# Patient Record
Sex: Female | Born: 1935
Health system: Southern US, Community
[De-identification: ages and names within clinical notes are randomized; demographics above are authoritative.]

## PROBLEM LIST (undated history)

## (undated) DIAGNOSIS — F0390 Unspecified dementia without behavioral disturbance: Secondary | ICD-10-CM

## (undated) DIAGNOSIS — T8859XA Other complications of anesthesia, initial encounter: Secondary | ICD-10-CM

## (undated) DIAGNOSIS — J449 Chronic obstructive pulmonary disease, unspecified: Secondary | ICD-10-CM

## (undated) DIAGNOSIS — T4145XA Adverse effect of unspecified anesthetic, initial encounter: Secondary | ICD-10-CM

## (undated) HISTORY — PX: ANKLE SURGERY: SHX546

## (undated) HISTORY — PX: JOINT REPLACEMENT: SHX530

---

## 1998-08-18 ENCOUNTER — Other Ambulatory Visit: Admission: RE | Admit: 1998-08-18 | Discharge: 1998-08-18 | Payer: Self-pay | Admitting: Family Medicine

## 1998-10-31 ENCOUNTER — Encounter: Payer: Self-pay | Admitting: *Deleted

## 1998-10-31 ENCOUNTER — Ambulatory Visit (HOSPITAL_COMMUNITY): Admission: RE | Admit: 1998-10-31 | Discharge: 1998-10-31 | Payer: Self-pay | Admitting: *Deleted

## 1999-06-26 ENCOUNTER — Encounter: Payer: Self-pay | Admitting: Orthopedic Surgery

## 1999-07-03 ENCOUNTER — Inpatient Hospital Stay (HOSPITAL_COMMUNITY): Admission: RE | Admit: 1999-07-03 | Discharge: 1999-07-07 | Payer: Self-pay | Admitting: Orthopedic Surgery

## 1999-07-03 ENCOUNTER — Encounter: Payer: Self-pay | Admitting: Orthopedic Surgery

## 1999-07-07 ENCOUNTER — Inpatient Hospital Stay (HOSPITAL_COMMUNITY)
Admission: RE | Admit: 1999-07-07 | Discharge: 1999-07-13 | Payer: Self-pay | Admitting: Physical Medicine & Rehabilitation

## 2000-05-15 ENCOUNTER — Other Ambulatory Visit: Admission: RE | Admit: 2000-05-15 | Discharge: 2000-05-15 | Payer: Self-pay | Admitting: Family Medicine

## 2000-05-17 ENCOUNTER — Encounter: Payer: Self-pay | Admitting: Family Medicine

## 2000-05-17 ENCOUNTER — Encounter: Admission: RE | Admit: 2000-05-17 | Discharge: 2000-05-17 | Payer: Self-pay | Admitting: Family Medicine

## 2000-11-01 ENCOUNTER — Inpatient Hospital Stay (HOSPITAL_COMMUNITY): Admission: EM | Admit: 2000-11-01 | Discharge: 2000-11-03 | Payer: Self-pay

## 2000-12-19 ENCOUNTER — Ambulatory Visit: Admission: RE | Admit: 2000-12-19 | Discharge: 2000-12-19 | Payer: Self-pay | Admitting: Pulmonary Disease

## 2001-05-23 ENCOUNTER — Encounter: Payer: Self-pay | Admitting: Family Medicine

## 2001-05-23 ENCOUNTER — Encounter: Admission: RE | Admit: 2001-05-23 | Discharge: 2001-05-23 | Payer: Self-pay | Admitting: Family Medicine

## 2002-01-21 ENCOUNTER — Encounter: Payer: Self-pay | Admitting: Family Medicine

## 2002-01-21 ENCOUNTER — Encounter: Admission: RE | Admit: 2002-01-21 | Discharge: 2002-01-21 | Payer: Self-pay | Admitting: Family Medicine

## 2002-09-09 ENCOUNTER — Encounter: Admission: RE | Admit: 2002-09-09 | Discharge: 2002-09-09 | Payer: Self-pay | Admitting: Family Medicine

## 2002-09-09 ENCOUNTER — Encounter: Payer: Self-pay | Admitting: Family Medicine

## 2002-10-06 ENCOUNTER — Emergency Department (HOSPITAL_COMMUNITY): Admission: EM | Admit: 2002-10-06 | Discharge: 2002-10-06 | Payer: Self-pay | Admitting: Emergency Medicine

## 2002-10-06 ENCOUNTER — Encounter: Payer: Self-pay | Admitting: Emergency Medicine

## 2003-06-09 ENCOUNTER — Encounter: Admission: RE | Admit: 2003-06-09 | Discharge: 2003-06-09 | Payer: Self-pay | Admitting: Family Medicine

## 2003-09-17 ENCOUNTER — Encounter: Admission: RE | Admit: 2003-09-17 | Discharge: 2003-09-17 | Payer: Self-pay | Admitting: Family Medicine

## 2004-04-24 ENCOUNTER — Encounter: Admission: RE | Admit: 2004-04-24 | Discharge: 2004-04-24 | Payer: Self-pay | Admitting: Family Medicine

## 2004-11-30 ENCOUNTER — Encounter: Admission: RE | Admit: 2004-11-30 | Discharge: 2004-11-30 | Payer: Self-pay | Admitting: Family Medicine

## 2007-03-27 ENCOUNTER — Encounter (INDEPENDENT_AMBULATORY_CARE_PROVIDER_SITE_OTHER): Payer: Self-pay | Admitting: Family Medicine

## 2007-03-28 ENCOUNTER — Ambulatory Visit: Payer: Self-pay | Admitting: Family Medicine

## 2007-03-28 DIAGNOSIS — M1990 Unspecified osteoarthritis, unspecified site: Secondary | ICD-10-CM | POA: Insufficient documentation

## 2007-03-28 DIAGNOSIS — M810 Age-related osteoporosis without current pathological fracture: Secondary | ICD-10-CM | POA: Insufficient documentation

## 2007-03-28 DIAGNOSIS — J449 Chronic obstructive pulmonary disease, unspecified: Secondary | ICD-10-CM | POA: Insufficient documentation

## 2007-03-28 DIAGNOSIS — R002 Palpitations: Secondary | ICD-10-CM

## 2007-03-28 DIAGNOSIS — R32 Unspecified urinary incontinence: Secondary | ICD-10-CM | POA: Insufficient documentation

## 2007-03-31 ENCOUNTER — Telehealth (INDEPENDENT_AMBULATORY_CARE_PROVIDER_SITE_OTHER): Payer: Self-pay | Admitting: *Deleted

## 2007-04-01 ENCOUNTER — Encounter (INDEPENDENT_AMBULATORY_CARE_PROVIDER_SITE_OTHER): Payer: Self-pay | Admitting: Family Medicine

## 2007-06-09 ENCOUNTER — Emergency Department (HOSPITAL_COMMUNITY): Admission: EM | Admit: 2007-06-09 | Discharge: 2007-06-10 | Payer: Self-pay | Admitting: Emergency Medicine

## 2007-06-16 ENCOUNTER — Ambulatory Visit: Payer: Self-pay | Admitting: Gastroenterology

## 2007-06-24 ENCOUNTER — Emergency Department (HOSPITAL_COMMUNITY): Admission: EM | Admit: 2007-06-24 | Discharge: 2007-06-24 | Payer: Self-pay | Admitting: Emergency Medicine

## 2007-06-25 ENCOUNTER — Telehealth (INDEPENDENT_AMBULATORY_CARE_PROVIDER_SITE_OTHER): Payer: Self-pay | Admitting: Family Medicine

## 2007-06-25 ENCOUNTER — Emergency Department (HOSPITAL_COMMUNITY): Admission: EM | Admit: 2007-06-25 | Discharge: 2007-06-25 | Payer: Self-pay | Admitting: *Deleted

## 2007-07-04 ENCOUNTER — Ambulatory Visit: Payer: Self-pay | Admitting: Gastroenterology

## 2007-07-04 ENCOUNTER — Encounter (INDEPENDENT_AMBULATORY_CARE_PROVIDER_SITE_OTHER): Payer: Self-pay | Admitting: Family Medicine

## 2007-07-04 ENCOUNTER — Encounter: Payer: Self-pay | Admitting: Gastroenterology

## 2007-07-21 ENCOUNTER — Ambulatory Visit: Payer: Self-pay | Admitting: Internal Medicine

## 2007-07-21 DIAGNOSIS — L0390 Cellulitis, unspecified: Secondary | ICD-10-CM

## 2007-07-21 DIAGNOSIS — L0291 Cutaneous abscess, unspecified: Secondary | ICD-10-CM | POA: Insufficient documentation

## 2007-07-23 ENCOUNTER — Telehealth (INDEPENDENT_AMBULATORY_CARE_PROVIDER_SITE_OTHER): Payer: Self-pay | Admitting: *Deleted

## 2007-07-28 ENCOUNTER — Encounter (HOSPITAL_BASED_OUTPATIENT_CLINIC_OR_DEPARTMENT_OTHER): Admission: RE | Admit: 2007-07-28 | Discharge: 2007-10-13 | Payer: Self-pay | Admitting: Surgery

## 2007-08-28 ENCOUNTER — Encounter: Payer: Self-pay | Admitting: Internal Medicine

## 2007-10-31 ENCOUNTER — Encounter (INDEPENDENT_AMBULATORY_CARE_PROVIDER_SITE_OTHER): Payer: Self-pay | Admitting: *Deleted

## 2009-06-20 ENCOUNTER — Inpatient Hospital Stay (HOSPITAL_COMMUNITY): Admission: RE | Admit: 2009-06-20 | Discharge: 2009-06-24 | Payer: Self-pay | Admitting: Orthopedic Surgery

## 2009-07-18 ENCOUNTER — Encounter: Admission: RE | Admit: 2009-07-18 | Discharge: 2009-10-04 | Payer: Self-pay | Admitting: Orthopedic Surgery

## 2010-02-01 ENCOUNTER — Inpatient Hospital Stay (HOSPITAL_COMMUNITY): Admission: EM | Admit: 2010-02-01 | Discharge: 2010-02-06 | Payer: Self-pay | Admitting: Emergency Medicine

## 2010-05-16 ENCOUNTER — Encounter
Admission: RE | Admit: 2010-05-16 | Discharge: 2010-06-07 | Payer: Self-pay | Source: Home / Self Care | Attending: Specialist | Admitting: Specialist

## 2010-06-07 ENCOUNTER — Encounter
Admission: RE | Admit: 2010-06-07 | Discharge: 2010-07-11 | Payer: Self-pay | Source: Home / Self Care | Attending: Specialist | Admitting: Specialist

## 2010-07-09 LAB — CONVERTED CEMR LAB
Basophils Absolute: 0 10*3/uL (ref 0.0–0.1)
Basophils Relative: 0 % (ref 0.0–1.0)
Eosinophils Absolute: 0.1 10*3/uL (ref 0.0–0.6)
Eosinophils Relative: 0.7 % (ref 0.0–5.0)
HCT: 41.5 % (ref 36.0–46.0)
Hemoglobin: 13.9 g/dL (ref 12.0–15.0)
Lymphocytes Relative: 11.2 % — ABNORMAL LOW (ref 12.0–46.0)
MCHC: 33.5 g/dL (ref 30.0–36.0)
MCV: 90.3 fL (ref 78.0–100.0)
Monocytes Absolute: 0.7 10*3/uL (ref 0.2–0.7)
Monocytes Relative: 8.2 % (ref 3.0–11.0)
Neutro Abs: 6.6 10*3/uL (ref 1.4–7.7)
Neutrophils Relative %: 79.9 % — ABNORMAL HIGH (ref 43.0–77.0)
Platelets: 223 10*3/uL (ref 150–400)
RBC: 4.59 M/uL (ref 3.87–5.11)
RDW: 13.4 % (ref 11.5–14.6)
TSH: 0.78 microintl units/mL (ref 0.35–5.50)
WBC: 8.3 10*3/uL (ref 4.5–10.5)

## 2010-07-13 ENCOUNTER — Ambulatory Visit: Payer: Medicare Other | Attending: Specialist | Admitting: Physical Therapy

## 2010-07-13 DIAGNOSIS — Z96649 Presence of unspecified artificial hip joint: Secondary | ICD-10-CM | POA: Insufficient documentation

## 2010-07-13 DIAGNOSIS — M25559 Pain in unspecified hip: Secondary | ICD-10-CM | POA: Insufficient documentation

## 2010-07-13 DIAGNOSIS — IMO0001 Reserved for inherently not codable concepts without codable children: Secondary | ICD-10-CM | POA: Insufficient documentation

## 2010-07-13 DIAGNOSIS — R262 Difficulty in walking, not elsewhere classified: Secondary | ICD-10-CM | POA: Insufficient documentation

## 2010-07-13 DIAGNOSIS — M25569 Pain in unspecified knee: Secondary | ICD-10-CM | POA: Insufficient documentation

## 2010-07-13 DIAGNOSIS — M25669 Stiffness of unspecified knee, not elsewhere classified: Secondary | ICD-10-CM | POA: Insufficient documentation

## 2010-07-13 DIAGNOSIS — Z96659 Presence of unspecified artificial knee joint: Secondary | ICD-10-CM | POA: Insufficient documentation

## 2010-07-18 ENCOUNTER — Encounter: Payer: Medicare Other | Admitting: Physical Therapy

## 2010-07-18 ENCOUNTER — Ambulatory Visit: Payer: Medicare Other | Admitting: Physical Therapy

## 2010-07-20 ENCOUNTER — Encounter: Payer: Medicare Other | Admitting: Physical Therapy

## 2010-07-21 ENCOUNTER — Ambulatory Visit: Payer: Medicare Other | Admitting: Physical Therapy

## 2010-07-25 ENCOUNTER — Ambulatory Visit: Payer: Medicare Other | Admitting: Physical Therapy

## 2010-07-27 ENCOUNTER — Ambulatory Visit: Payer: Medicare Other | Admitting: Physical Therapy

## 2010-08-25 LAB — CBC
HCT: 36.2 % (ref 36.0–46.0)
HCT: 40.5 % (ref 36.0–46.0)
HCT: 42.4 % (ref 36.0–46.0)
Hemoglobin: 12.2 g/dL (ref 12.0–15.0)
Hemoglobin: 13.7 g/dL (ref 12.0–15.0)
Hemoglobin: 14.4 g/dL (ref 12.0–15.0)
MCH: 29.8 pg (ref 26.0–34.0)
MCH: 29.9 pg (ref 26.0–34.0)
MCH: 30.3 pg (ref 26.0–34.0)
MCHC: 33.7 g/dL (ref 30.0–36.0)
MCHC: 33.7 g/dL (ref 30.0–36.0)
MCHC: 34.1 g/dL (ref 30.0–36.0)
MCV: 88.3 fL (ref 78.0–100.0)
MCV: 88.7 fL (ref 78.0–100.0)
MCV: 89 fL (ref 78.0–100.0)
Platelets: 148 10*3/uL — ABNORMAL LOW (ref 150–400)
Platelets: 153 10*3/uL (ref 150–400)
Platelets: 167 10*3/uL (ref 150–400)
RBC: 4.08 MIL/uL (ref 3.87–5.11)
RBC: 4.59 MIL/uL (ref 3.87–5.11)
RBC: 4.77 MIL/uL (ref 3.87–5.11)
RDW: 15.1 % (ref 11.5–15.5)
RDW: 15.2 % (ref 11.5–15.5)
RDW: 15.2 % (ref 11.5–15.5)
WBC: 5.4 10*3/uL (ref 4.0–10.5)
WBC: 7.6 10*3/uL (ref 4.0–10.5)
WBC: 9 10*3/uL (ref 4.0–10.5)

## 2010-08-25 LAB — BASIC METABOLIC PANEL
BUN: 5 mg/dL — ABNORMAL LOW (ref 6–23)
BUN: 7 mg/dL (ref 6–23)
BUN: 8 mg/dL (ref 6–23)
CO2: 27 mEq/L (ref 19–32)
CO2: 28 mEq/L (ref 19–32)
CO2: 28 mEq/L (ref 19–32)
Calcium: 8.7 mg/dL (ref 8.4–10.5)
Calcium: 8.8 mg/dL (ref 8.4–10.5)
Calcium: 9.2 mg/dL (ref 8.4–10.5)
Chloride: 106 mEq/L (ref 96–112)
Chloride: 108 mEq/L (ref 96–112)
Chloride: 109 mEq/L (ref 96–112)
Creatinine, Ser: 0.67 mg/dL (ref 0.4–1.2)
Creatinine, Ser: 0.71 mg/dL (ref 0.4–1.2)
Creatinine, Ser: 0.9 mg/dL (ref 0.4–1.2)
GFR calc Af Amer: >60 mL/min (ref >60)
GFR calc Af Amer: >60 mL/min (ref >60)
GFR calc Af Amer: >60 mL/min (ref >60)
GFR calc non Af Amer: >60 mL/min (ref >60)
GFR calc non Af Amer: >60 mL/min (ref >60)
GFR calc non Af Amer: >60 mL/min (ref >60)
Glucose, Bld: 120 mg/dL — ABNORMAL HIGH (ref 70–99)
Glucose, Bld: 86 mg/dL (ref 70–99)
Glucose, Bld: 91 mg/dL (ref 70–99)
Potassium: 3.4 mEq/L — ABNORMAL LOW (ref 3.5–5.1)
Potassium: 3.8 mEq/L (ref 3.5–5.1)
Potassium: 3.9 mEq/L (ref 3.5–5.1)
Sodium: 139 mEq/L (ref 135–145)
Sodium: 140 mEq/L (ref 135–145)
Sodium: 142 mEq/L (ref 135–145)

## 2010-08-25 LAB — URINALYSIS, MICROSCOPIC ONLY
Bilirubin Urine: NEGATIVE
Glucose, UA: NEGATIVE mg/dL
Ketones, ur: NEGATIVE mg/dL
Nitrite: NEGATIVE
Protein, ur: 30 mg/dL — AB
Specific Gravity, Urine: 1.012 (ref 1.005–1.030)
Urobilinogen, UA: 0.2 mg/dL (ref 0.0–1.0)
pH: 7.5 (ref 5.0–8.0)

## 2010-08-25 LAB — DIFFERENTIAL
Basophils Absolute: 0 10*3/uL (ref 0.0–0.1)
Basophils Relative: 0 % (ref 0–1)
Eosinophils Absolute: 0 10*3/uL (ref 0.0–0.7)
Eosinophils Relative: 0 % (ref 0–5)
Lymphocytes Relative: 12 % (ref 12–46)
Lymphs Abs: 0.9 10*3/uL (ref 0.7–4.0)
Monocytes Absolute: 0.6 10*3/uL (ref 0.1–1.0)
Monocytes Relative: 7 % (ref 3–12)
Neutro Abs: 6 10*3/uL (ref 1.7–7.7)
Neutrophils Relative %: 80 % — ABNORMAL HIGH (ref 43–77)

## 2010-08-25 LAB — URINE CULTURE
Colony Count: >=100000
Culture  Setup Time: 201108300115

## 2010-08-27 LAB — BASIC METABOLIC PANEL
BUN: 3 mg/dL — ABNORMAL LOW (ref 6–23)
BUN: 5 mg/dL — ABNORMAL LOW (ref 6–23)
CO2: 26 mEq/L (ref 19–32)
CO2: 28 mEq/L (ref 19–32)
Calcium: 8 mg/dL — ABNORMAL LOW (ref 8.4–10.5)
Chloride: 100 mEq/L (ref 96–112)
Chloride: 103 mEq/L (ref 96–112)
Chloride: 107 mEq/L (ref 96–112)
Creatinine, Ser: 0.6 mg/dL (ref 0.4–1.2)
Creatinine, Ser: 0.64 mg/dL (ref 0.4–1.2)
GFR calc Af Amer: >60 mL/min (ref >60)
Glucose, Bld: 155 mg/dL — ABNORMAL HIGH (ref 70–99)
Glucose, Bld: 94 mg/dL (ref 70–99)
Potassium: 3.2 mEq/L — ABNORMAL LOW (ref 3.5–5.1)
Potassium: 3.8 mEq/L (ref 3.5–5.1)
Potassium: 4.3 mEq/L (ref 3.5–5.1)
Sodium: 133 mEq/L — ABNORMAL LOW (ref 135–145)

## 2010-08-27 LAB — CBC
HCT: 28.6 % — ABNORMAL LOW (ref 36.0–46.0)
HCT: 31.3 % — ABNORMAL LOW (ref 36.0–46.0)
HCT: 33.1 % — ABNORMAL LOW (ref 36.0–46.0)
HCT: 41.4 % (ref 36.0–46.0)
Hemoglobin: 10.5 g/dL — ABNORMAL LOW (ref 12.0–15.0)
Hemoglobin: 10.6 g/dL — ABNORMAL LOW (ref 12.0–15.0)
Hemoglobin: 13.7 g/dL (ref 12.0–15.0)
MCHC: 33.2 g/dL (ref 30.0–36.0)
MCHC: 33.5 g/dL (ref 30.0–36.0)
MCHC: 33.6 g/dL (ref 30.0–36.0)
MCHC: 33.9 g/dL (ref 30.0–36.0)
MCHC: 33.9 g/dL (ref 30.0–36.0)
MCV: 90.1 fL (ref 78.0–100.0)
MCV: 90.1 fL (ref 78.0–100.0)
MCV: 90.5 fL (ref 78.0–100.0)
MCV: 90.7 fL (ref 78.0–100.0)
MCV: 91.2 fL (ref 78.0–100.0)
Platelets: 121 10*3/uL — ABNORMAL LOW (ref 150–400)
Platelets: 130 10*3/uL — ABNORMAL LOW (ref 150–400)
Platelets: 187 10*3/uL (ref 150–400)
RBC: 3.48 MIL/uL — ABNORMAL LOW (ref 3.87–5.11)
RBC: 3.51 MIL/uL — ABNORMAL LOW (ref 3.87–5.11)
RBC: 4.57 MIL/uL (ref 3.87–5.11)
RDW: 13.8 % (ref 11.5–15.5)
RDW: 13.8 % (ref 11.5–15.5)
RDW: 14.1 % (ref 11.5–15.5)
RDW: 14.6 % (ref 11.5–15.5)
WBC: 6.7 10*3/uL (ref 4.0–10.5)
WBC: 7.3 10*3/uL (ref 4.0–10.5)

## 2010-08-27 LAB — URINALYSIS, ROUTINE W REFLEX MICROSCOPIC
Bilirubin Urine: NEGATIVE
Glucose, UA: NEGATIVE mg/dL
Hgb urine dipstick: NEGATIVE
Ketones, ur: NEGATIVE mg/dL
Nitrite: NEGATIVE
Protein, ur: NEGATIVE mg/dL
Specific Gravity, Urine: 1.013 (ref 1.005–1.030)
Urobilinogen, UA: 0.2 mg/dL (ref 0.0–1.0)
pH: 7.5 (ref 5.0–8.0)

## 2010-08-27 LAB — PROTIME-INR
INR: 1.01 (ref 0.00–1.49)
INR: 1.13 (ref 0.00–1.49)
Prothrombin Time: 13.2 seconds (ref 11.6–15.2)
Prothrombin Time: 20.5 seconds — ABNORMAL HIGH (ref 11.6–15.2)

## 2010-08-27 LAB — COMPREHENSIVE METABOLIC PANEL
ALT: 16 U/L (ref 0–35)
AST: 26 U/L (ref 0–37)
Albumin: 3.8 g/dL (ref 3.5–5.2)
Alkaline Phosphatase: 77 U/L (ref 39–117)
BUN: 10 mg/dL (ref 6–23)
CO2: 30 mEq/L (ref 19–32)
Calcium: 9.2 mg/dL (ref 8.4–10.5)
Chloride: 105 mEq/L (ref 96–112)
Creatinine, Ser: 0.77 mg/dL (ref 0.4–1.2)
GFR calc Af Amer: >60 mL/min (ref >60)
GFR calc non Af Amer: >60 mL/min (ref >60)
Glucose, Bld: 92 mg/dL (ref 70–99)
Potassium: 3.8 mEq/L (ref 3.5–5.1)
Sodium: 142 mEq/L (ref 135–145)
Total Bilirubin: 0.6 mg/dL (ref 0.3–1.2)
Total Protein: 6.6 g/dL (ref 6.0–8.3)

## 2010-08-27 LAB — TYPE AND SCREEN: ABO/RH(D): O POS

## 2010-08-27 LAB — ABO/RH: ABO/RH(D): O POS

## 2010-08-27 LAB — APTT: aPTT: 30 seconds (ref 24–37)

## 2010-10-24 NOTE — Assessment & Plan Note (Signed)
[facility]   NAME:  Donna Flowers, Donna Flowers               ACCOUNT NO.:  [id number]   MEDICAL RECORD NO.:  [id number]      DATE OF BIRTH:  10/06/1927   PHYSICIAN:  Theresia Majors. Tanda Rockers, M.D. VISIT DATE:  09/04/2007                                   OFFICE VISIT   SUBJECTIVE:  The patient is a 77 year old lady who we have treated for  stasis ulcer involving her right lower extremity.  In the interim she  has worn compressive wraps.  She returns for follow-up.  There has been  no excessive drainage, malodor, pain, or fever.   OBJECTIVE:  VITAL SIGNS:  Blood pressure 141/73, respirations 16, pulse  rate 76, and temperature 98.  EXTREMITIES:  Inspection of her right lower extremity shows that the  ulcer is completely resolved.  The edema has been reasonably controlled  with the compression.   PLAN:  We are discharging the patient with a prescription for bilateral  open toe 30-40 mm compression hose.  We instructed her in the use of  compression garments.  We have instructed her to keep her routine  medical appointments with her primary care physician.  We have expressed  a willingness to reevaluate her on a p.r.n. basis.      Harold A. Tanda Rockers, M.D.  Electronically Signed     HAN/MEDQ  D:  09/04/2007  T:  09/04/2007  Job:  [id number]   cc:   Willow Ora, MD

## 2010-10-24 NOTE — Assessment & Plan Note (Signed)
[facility]   NAME:  Donna Flowers, Donna Flowers               ACCOUNT NO.:  [id number]   MEDICAL RECORD NO.:  [id number]      DATE OF BIRTH:  January 21, 1928   PHYSICIAN:  Theresia Majors. Tanda Rockers, M.D. VISIT DATE:  08/28/2007                                   OFFICE VISIT   SUBJECTIVE:  The patient is a 77 year old lady we are following for a  stasis ulcer involving the right lower extremity.  In the interim she  has worn an Print production planner.  There has been no excessive drainage, malodor,  pain, or fever.  She returns for follow-up.   OBJECTIVE:  VITAL SIGNS:  Blood pressure 139/76, respirations 16, pulse  rate 73, temperature 97.6.  EXTREMITIES:  Right lower extremity shows that the ulcer has  significantly decreased.  There is a miniscule area approximately 3 mm  in diameter with minimum inflammatory reaction and no evidence of  ascending infection.  The foot is warm, but it is not feverish.  The  capillary refill is brisk.  There is no associated ischemia.  The edema  is moderately well controlled as manifested by linear wrinkles.  There  is no evidence of wrap injury.   ASSESSMENT:  Satisfactory response to compression of the stasis ulcer.   PLAN:  We will return the patient to an [place] boot.  She has been given a  prescription for bilateral below the knee 20-30 mm external support  hose.  The nurse will reevaluate her weekly.  She will be reevaluated by  the physician in 2 weeks.      Harold A. Tanda Rockers, M.D.  Electronically Signed     HAN/MEDQ  D:  08/28/2007  T:  08/28/2007  Job:  [id number]

## 2010-10-24 NOTE — Consult Note (Signed)
Donna Flowers, Donna Flowers               ACCOUNT NO.:  [id number]   MEDICAL RECORD NO.:  [id number]          PATIENT TYPE:  REC   LOCATION:  FOOT                         FACILITY:  [facility]   PHYSICIAN:  Harold A. Tanda Rockers, M.D.DATE OF BIRTH:  1927/09/26   DATE OF CONSULTATION:  07/31/2007  DATE OF DISCHARGE:                                 CONSULTATION   SUBJECTIVE:  Donna Flowers is a 77 year old lady referred by Dr. Willow Ora  for evaluation of an ulceration on the lateral aspect of the right lower  extremity.   IMPRESSION:  Post-traumatic stasis ulcer.   RECOMMENDATIONS:  The wound was thoroughly debrided under an anesthetic  mixture block followed by the placement of a compressive wrap.  We will  recommend we proceed with an Unna boot protocol, anticipating complete  resolution.   SUBJECTIVE:  Donna Flowers is a 77 year old lady who injured her right  lower extremity 10 weeks ago.  She has treated this wound intermittently  with gauze dressings and over-the-counter antibiotics.  (Topical).  She  has also had 1 course of p.o. Keflex.  The wound has shown some minimal  improvement but is persistent, is associated with swelling, drainage and  redness.  She has moderate pain that does not prohibit her from  sleeping.  She continues to be ambulatory.   PAST MEDICAL HISTORY:  Remarkable for arthritis and COPD.   CURRENT MEDICATIONS:  1. Os-Cal, vitamin C, Caltrate and an aspirin.  In addition, she takes      Spiriva inhaler.  2. Detrol 2 mg b.i.d.  3. Keflex 500 mg q.6 h.  She has had a 10-day course and Forteo 750      mcg injection daily subcu.   She denies allergies.   PREVIOUS SURGERIES:  1. Right hip replacement x2.  2. A right ankle open reduction and internal fixation.  3. Closed reduction of the right wrist.   FAMILY HISTORY:  Positive for vascular disease, diabetes and cancer.   SOCIALLY:  She is married; she is still employed.  She has adult  children who live in the local  area.   REVIEW OF SYSTEMS:  She has exercise tolerance that is compromised due  to her arthritis, primarily in her knees.  She is able to walk  independently.  She specifically denies angina pectoris.  She denies  transient visual losses or paralysis or any other stigmata of TIAs.  She  has some urinary stress incontinence.  There are no GI complaints.  Her  appetite is good.  Her weight is stable.   PHYSICAL EXAM:  She is an alert, oriented female in no acute distress.  She is in good contact with reality and is able to give her history in  detail.  Her blood pressure is 160/73, respirations 16, pulse rate 77,  temperature 97.6.  HEENT:  Clear.  NECK:  Supple.  Trachea is midline.  Thyroid is nonpalpable.  LUNGS:  Clear.  The heart sounds were normal.  ABDOMEN:  Soft.  EXTREMITIES:  Warm with bilateral +2 edema.  There is hyperemia in the  right lower  extremity associated with a full-thickness ulceration with  varying amounts of subcutaneous necrosis and soft as well as mature  eschar.  There is a halo of erythema.  There is no malodor.  The wound  measures 4 cm in its largest diameter.  The wound was measured and  photographed; please refer to the data entries.  The pedal pulses are  faintly palpable.  A pencil Doppler shows a normal ABI bilaterally with  attenuated Doppler wave signals.  The patient retains protective  sensation to the Semmes-Weinstein filament.  There are no dominant skin  lesions.  There is no regional adenopathy.   DISCUSSION:  Donna Flowers has a traumatic wound which is now converted to  stasis physiology with significant fluid retention.  We have recommended  that we proceed with an Unna boot protocol following adequate  debridement.  There is no active infection.  There is no critical flow  reduction.  We anticipate that this wound should respond to compression  therapy.  We will reevaluate her weekly, and we will perform  debridements as indicated.  We  have given the patient an opportunity to  ask questions.  She seems to understand the clinical impression and the  treatment plan.  She expresses gratitude for having been seen in the  clinic and indicates that she will be compliant.      Harold A. Tanda Rockers, M.D.  Electronically Signed     HAN/MEDQ  D:  08/01/2007  T:  08/02/2007  Job:  [id number]   cc:   Willow Ora, MD

## 2010-10-24 NOTE — Assessment & Plan Note (Signed)
[facility]   NAME:  Donna Flowers, Donna Flowers               ACCOUNT NO.:  [id number]   MEDICAL RECORD NO.:  [id number]      DATE OF BIRTH:  [birth year]   PHYSICIAN:  Theresia Majors. Tanda Rockers, M.D. VISIT DATE:  08/05/2007                                   OFFICE VISIT   SUBJECTIVE:  [name] is a 77 year old female whom we have treated for  post-traumatic stasis ulcer involving her right lower extremity.  She  continues to be ambulatory.  She has been wearing a [place] wrap.   OBJECTIVE:  Blood pressure is 132/71, respirations 16, pulse rate 63,  temperature is 97.  Looking at the right lower extremity, there has been adequate  compression as manifested by lineal wrinkles. The ulcer itself has  decreased significantly.  There is now a 100% granulating base with  early epithelium advancing from the periphery.  The capillary refill is  brisk.  There is no evidence of ischemia.  There is no evidence of a  ascending infection.   ASSESSMENT:  Clinical improvement of stasis ulcer.   RECOMMENDATIONS:  We will change the patient to an Radio broadcast assistant.  We will  reevaluate her in 1 week.      Harold A. Tanda Rockers, M.D.  Electronically Signed     HAN/MEDQ  D:  08/05/2007  T:  08/05/2007  Job:  [id number]

## 2010-10-24 NOTE — Assessment & Plan Note (Signed)
[facility]   NAME:  Donna Flowers, Donna Flowers               ACCOUNT NO.:  [id number]   MEDICAL RECORD NO.:  [id number]      DATE OF BIRTH:  07/01/1927   PHYSICIAN:  Theresia Majors. Tanda Rockers, M.D. VISIT DATE:  08/14/2007                                   OFFICE VISIT   SUBJECTIVE:  Donna Flowers is a 77 year old lady who we are seeing in  followup for stasis ulcer.  In the interim, she has worn an Radio broadcast assistant.  She continues to be ambulatory.  There has been no excessive drainage,  malodor, pain or fever.   OBJECTIVE:  VITAL SIGNS:  Blood pressure is 149/68, respirations 16,  pulse rate 75, temperature is 98.2.  EXTREMITIES:  Inspection of the right lateral lower extremity shows that  there is continued contraction of the ulcer.  The wound has a halo of  advancing epithelium with a healthy-appearing granulating base.  There  is no evidence of infection.  The pedal pulse remains palpable.   ASSESSMENT:  Clinical improvement of stasis ulcer.   PLAN:  We will continue the Unna boot with followup with the nurse for  dressing change in 1 week and re-evaluation by the physician in 2 weeks.      Harold A. Tanda Rockers, M.D.  Electronically Signed     HAN/MEDQ  D:  08/14/2007  T:  08/15/2007  Job:  [id number]

## 2010-10-27 NOTE — Discharge Summary (Signed)
[place]  Patient:    Donna Flowers, Donna Flowers                      MRN: [id number] Adm. Date:  [id number] Disc. Date: 11/03/00 Attending:  Selina Cooley CC:         Redmond Baseman, M.D.   Discharge Summary  DISCHARGE DIAGNOSES:  1. Left lower lobe pneumonia.  2. Reactive airway disease/chronic obstructive pulmonary disease     exacerbation.  3. Chest tightness secondary to above diagnoses, rule out myocardial     infarction.  Negative Cardiolite in January 2001, with ejection fraction     of 76% by Dr. Fraser Din.  Nonspecific T-wave changes by electrocardiogram.  4. History of pneumonia in November 2001.  5. Status post right hip replacement in 1990, secondary to avascular     necrosis with a redo in January 2001, by Dr. Darrelyn Hillock.  6. Dyslipidemia.  7. Dehydration with lightheadedness.  8. Anorexia and weight loss secondary to acute illness.  9. History of urinary incontinence. 10. Postmenopausal. 11. Osteoporosis with multiple fractures, including right wrist and left elbow     in 1999, right ankle in 1992 or 1993. 12. Status post total abdominal hysterectomy with bladder tack in 1982. 13. History of migraines, remote. 14. History of hypertension. 15. History of environmental allergies per chart, however, not confirmed by     patient. 16. Mammogram negative, December 2001.  Guaiac negative, March 2002.  DISCHARGE MEDICATIONS: 1. Prednisone 40 mg p.o. q.d. x 4 additional days. 2. Tequin 400 mg p.o. q.d. x 8 more days. 3. Albuterol/Atrovent nebulizer therapy q.6h. x 2 days. 4. Combivent 2 puffs q.i.d. to be resumed after two days of nebulizer therapy. 5. Pulmicort 1 puff p.o. b.i.d. to be resumed in two to three days following    discharge. 6. Enteric-coated baby aspirin 81 mg p.o. q.d. 7. Zocor 20 mg p.o. q. h.s. 8. Detrol 2 mg p.o. b.i.d. 9. Calcium supplement 1200 mg p.o. q.d. 10. Multivitamin 1 p.o. q.d. 11. Fosamax 70 mg p.o. q.  week (Wednesday). 12. Evista 50 mg p.o. q.d.  CONDITION ON DISCHARGE:  The patient is alert and oriented, in no acute distress with saturations of 92% to 93% on room air.  She is still occasionally coughing, however, she is able to ambulate without any respiratory distress.  CONSULTANTS:  None.  PROCEDURES:  Chest x-ray revealing a left lower lobe infiltrate with associated atelectasis, serial enzymes which were negative for cardiac ischemia.  EKGs revealed normal sinus rhythm, rate in the 80s with no ischemic changes.  REASON FOR ADMISSION:  The patient is a 77 year old white female with a history of reactive airway disease and several cardiac risk factors.  She was admitted via her PCPs office for five weeks of progressive cough, shortness of breath, chest tightness, and two days of substernal burning and dehydration. She noted through the course of her illness that she was given Pulmicort and Combivent inhalers, as well as a prednisone taper which she felt was somewhat helpful with her cough.  Despite this though, she still noticed progressive cough and fatigue and inability to take in adequate fluids.  She described chest tightness bilaterally which occasionally radiated to both sides of the jaw.  The chest tightness was worse after a tussive episode.  HOSPITAL COURSE:  #1 - LEFT LOWER LOBE PNEUMONIA WITH ASSOCIATED BRONCHOSPASM:  The patient was started on antibiotics, Tequin, and will complete a ten day course.  She was also given one dose of IV steroids and subsequently placed on p.o. steroids. She will complete a course of oral steroids after discharge.  She noted marked improvement in her symptoms with nebulizer therapy, therefore, she will be discharged on a couple of day of Albuterol/Atrovent nebulizers which can then be transitioned back to her usual Combivent.  She was also administered magnesium sulfate and an antitussive on admission.  It was felt that the  chest tightness that she was experiencing was most likely related to these issues.  #2 - REACTIVE AIRWAY DISEASE VERSUS CHRONIC OBSTRUCTIVE PULMONARY DISEASE: The patient clearly has evidence of emphysematous changes by chest x-ray, however, she has never been a smoker.  Her disease may in fact be due to second hand smoke.  Would recommend once this episode resolves to refer the patient for pulmonary function tests to establish her current baseline.  #3 - CHEST TIGHTNESS:  Once again, this symptoms is attributed to her pneumonia and bronchospasm with improvement on discharge.  The patient did have a negative Cardiolite approximately one year ago.  She does have several risk factors, including dyslipidemia, advanced age, postmenopausal state, and father with his first myocardial infarction in his 16s.  She also has a history of hypertension, but is on no medications and has normal blood pressure currently.  Given these risk factors and concern for cardiac ischemia, she was ruled out for myocardial infarction.  Three sets of enzymes were negative.  It was felt that a repeat risk stratification was not necessary at this time.  Electrocardiograms were not concerning for acute ischemia.  #4 - OSTEOPOROSIS:  The patient was continued on her calcium and vitamin supplements, as well as Evista and Fosamax.  #5 - DEHYDRATION/LIGHTHEADEDNESS:  The patient did have very mild renal insufficiency on admission secondary to dehydration.  Her creatinine is 0.8 on discharge and she is tolerating a p.o. diet.  Thirty minutes was spent with the patient reviewing medication changes, diagnoses, and follow up plans. DD:  11/03/00 TD:  11/03/00 Job: [id number]

## 2011-10-04 ENCOUNTER — Emergency Department (HOSPITAL_COMMUNITY)
Admission: EM | Admit: 2011-10-04 | Discharge: 2011-10-04 | Disposition: A | Discharge status: Home / Self Care | Payer: Medicare Other | Attending: Emergency Medicine | Admitting: Emergency Medicine

## 2011-10-04 ENCOUNTER — Emergency Department (HOSPITAL_COMMUNITY): Payer: Medicare Other

## 2011-10-04 ENCOUNTER — Encounter (HOSPITAL_COMMUNITY): Payer: Self-pay | Admitting: *Deleted

## 2011-10-04 DIAGNOSIS — F0390 Unspecified dementia without behavioral disturbance: Secondary | ICD-10-CM | POA: Insufficient documentation

## 2011-10-04 DIAGNOSIS — R059 Cough, unspecified: Secondary | ICD-10-CM | POA: Insufficient documentation

## 2011-10-04 DIAGNOSIS — R0602 Shortness of breath: Secondary | ICD-10-CM | POA: Diagnosis not present

## 2011-10-04 DIAGNOSIS — R05 Cough: Secondary | ICD-10-CM | POA: Diagnosis not present

## 2011-10-04 DIAGNOSIS — J3489 Other specified disorders of nose and nasal sinuses: Secondary | ICD-10-CM | POA: Diagnosis not present

## 2011-10-04 DIAGNOSIS — J449 Chronic obstructive pulmonary disease, unspecified: Secondary | ICD-10-CM | POA: Diagnosis not present

## 2011-10-04 DIAGNOSIS — J441 Chronic obstructive pulmonary disease with (acute) exacerbation: Secondary | ICD-10-CM | POA: Diagnosis not present

## 2011-10-04 HISTORY — DX: Chronic obstructive pulmonary disease, unspecified: J44.9

## 2011-10-04 HISTORY — DX: Unspecified dementia, unspecified severity, without behavioral disturbance, psychotic disturbance, mood disturbance, and anxiety: F03.90

## 2011-10-04 MED ORDER — ALBUTEROL SULFATE HFA 108 (90 BASE) MCG/ACT IN AERS
2.0000 | INHALATION_SPRAY | RESPIRATORY_TRACT | Status: DC | PRN
  Administered 2011-10-04: 2 via RESPIRATORY_TRACT
  Filled 2011-10-04: qty 6.7

## 2011-10-04 MED ORDER — PREDNISONE 20 MG PO TABS
60.0000 mg | ORAL_TABLET | Freq: Once | ORAL | Status: AC
  Administered 2011-10-04: 60 mg via ORAL
  Filled 2011-10-04: qty 3

## 2011-10-04 MED ORDER — PREDNISONE 20 MG PO TABS: 40.0000 mg | ORAL_TABLET | Freq: Every day | ORAL | Status: AC

## 2011-10-04 MED ORDER — ALBUTEROL SULFATE (5 MG/ML) 0.5% IN NEBU
5.0000 mg | INHALATION_SOLUTION | Freq: Once | RESPIRATORY_TRACT | Status: AC
  Administered 2011-10-04: 5 mg via RESPIRATORY_TRACT
  Filled 2011-10-04: qty 1

## 2011-10-04 MED ORDER — IPRATROPIUM BROMIDE 0.02 % IN SOLN
0.5000 mg | Freq: Once | RESPIRATORY_TRACT | Status: AC
  Administered 2011-10-04: 0.5 mg via RESPIRATORY_TRACT
  Filled 2011-10-04: qty 2.5

## 2011-10-04 NOTE — ED Provider Notes (Addendum)
History     CSN: [id number]  Arrival date & time 10/04/11  4098   First MD Initiated Contact with Patient 10/04/11 1902      Chief Complaint  Patient presents with  . Cough  . Shortness of Breath    (Consider location/radiation/quality/duration/timing/severity/associated sxs/prior treatment) Patient is a 78 y.o. female presenting with cough and shortness of breath. The history is provided by the patient.  Cough This is a new problem. The current episode started yesterday. The problem occurs constantly. The problem has been gradually worsening. The cough is non-productive. There has been no fever. Associated symptoms include rhinorrhea, shortness of breath and wheezing. Pertinent negatives include no chest pain and no ear congestion. She has tried nothing for the symptoms. The treatment provided no relief. She is not a smoker. Her past medical history is significant for COPD.  Shortness of Breath  Associated symptoms include rhinorrhea, cough, shortness of breath and wheezing. Pertinent negatives include no chest pain.    Past Medical History  Diagnosis Date  . COPD (chronic obstructive pulmonary disease)   . Osteoporosis   . Dementia     Past Surgical History  Procedure Date  . Joint replacement     right hip, left knee  . Ankle surgery     right    No family history on file.  History  Substance Use Topics  . Smoking status: Never Smoker   . Smokeless tobacco: Not on file  . Alcohol Use: 4.2 oz/week    7 Glasses of wine per week    OB History    Grav Para Term Preterm Abortions TAB SAB Ect Mult Living                  Review of Systems  HENT: Positive for rhinorrhea.   Respiratory: Positive for cough, shortness of breath and wheezing.   Cardiovascular: Negative for chest pain.  All other systems reviewed and are negative.    Allergies  Review of patient's allergies indicates no known allergies.  Home Medications   Current Outpatient Rx  Name Route  Sig Dispense Refill  . ASPIRIN EC 325 MG PO TBEC Oral Take 325 mg by mouth daily as needed. For pain.    Marland Kitchen CALCIUM CARBONATE-VITAMIN D 500-200 MG-UNIT PO TABS Oral Take 1 tablet by mouth daily.    . DONEPEZIL HCL 5 MG PO TABS Oral Take 5 mg by mouth daily.      BP 187/84  Pulse 88  Temp(Src) 97.7 F (36.5 C) (Oral)  Resp 18  Wt 115 lb (52.164 kg)  SpO2 97%  Physical Exam  Nursing note and vitals reviewed. Constitutional: She is oriented to person, place, and time. She appears well-developed and well-nourished. No distress.  HENT:  Head: Normocephalic and atraumatic.  Right Ear: Tympanic membrane and ear canal normal.  Left Ear: Tympanic membrane and ear canal normal.  Nose: Mucosal edema and rhinorrhea present.  Eyes: EOM are normal. Pupils are equal, round, and reactive to light.  Cardiovascular: Normal rate, regular rhythm, normal heart sounds and intact distal pulses.  Exam reveals no friction rub.   No murmur heard. Pulmonary/Chest: No respiratory distress. She has decreased breath sounds. She has wheezes. She has no rales.  Abdominal: Soft. Bowel sounds are normal. She exhibits no distension. There is no tenderness. There is no rebound and no guarding.  Musculoskeletal: Normal range of motion. She exhibits no tenderness.       No edema  Neurological: She is alert  and oriented to person, place, and time. No cranial nerve deficit.  Skin: Skin is warm and dry. No rash noted.  Psychiatric: She has a normal mood and affect. Her behavior is normal.    ED Course  Procedures (including critical care time)  Labs Reviewed - No data to display Dg Chest 2 View  10/04/2011  *RADIOLOGY REPORT*  Clinical Data: Cough and shortness of breath  CHEST - 2 VIEW  Comparison: 04/24/2004  Findings: Hyperinflation suggests COPD.  Curvilinear left lower lobe atelectasis or scarring noted.  Heart size is upper limits of normal.  Biapical pleural thickening again noted.  No new focal pulmonary  opacity otherwise.  No pleural effusion. There is minimal irregularity at the lateral aspect of the left tenth rib, but this is not well seen.  IMPRESSION: Hyperinflation compatible COPD.  Left lower lobe scarring or atelectasis.  Possible nondisplaced fracture of the left lateral tenth rib. Considered detailed rib radiographs for further evaluation if rib fracture is suspected.  Original Report Authenticated By: Harrel Lemon, M.D.     1. COPD exacerbation       MDM   Patient with history of COPD who has been off inhalers for 2 years who yesterday started to develop shortness of breath. On exam she has decreased air movement diffusely and some mild rhonchi and wheezes. She states she gets allergy but is not taking any medication for them. She denies fever, nausea, vomiting, chest pain. She has no significant signs of congestive heart failure. Feel this is most likely a COPD exacerbation due to seasonal allergies. Patient given albuterol, Atrovent and prednisone. Chest x-ray pending.   Pt is having no chest pains fibular rib fracture would be unlikely. No signs of acute pathology.  9:46 PM Patient feeling much better after breathing treatments. Repeat lung exam has good breath sounds no wheezing. Ambulated patient and she did well will discharge, steroids.   Gwyneth Sprout, MD 10/04/11 1940  Gwyneth Sprout, MD 10/04/11 1610  Gwyneth Sprout, MD 10/04/11 2148

## 2011-10-04 NOTE — Discharge Instructions (Signed)

## 2011-10-04 NOTE — ED Notes (Signed)
Pt ambulated down the hall. Had a steady, even gait. O2 was 90% RA upon standing and with first few steps, O2 gradually decreased to 88% RA while ambulating and returned to 91% RA near the completion of the walk.

## 2011-10-04 NOTE — ED Notes (Signed)
Pt states "this all just started today, don't cough anything up, lost my voice & everything"; LS ausculated & wheezes to RLL, rhonci to LLL

## 2011-10-08 ENCOUNTER — Ambulatory Visit: Payer: Medicare Other | Admitting: Internal Medicine

## 2011-10-22 ENCOUNTER — Ambulatory Visit (INDEPENDENT_AMBULATORY_CARE_PROVIDER_SITE_OTHER): Payer: Medicare Other | Admitting: Internal Medicine

## 2011-10-22 ENCOUNTER — Encounter: Payer: Self-pay | Admitting: Internal Medicine

## 2011-10-22 VITALS — BP 144/82 | HR 66 | Temp 97.8°F | Wt 112.0 lb

## 2011-10-22 DIAGNOSIS — J449 Chronic obstructive pulmonary disease, unspecified: Secondary | ICD-10-CM | POA: Diagnosis not present

## 2011-10-22 DIAGNOSIS — M1990 Unspecified osteoarthritis, unspecified site: Secondary | ICD-10-CM

## 2011-10-22 DIAGNOSIS — R413 Other amnesia: Secondary | ICD-10-CM | POA: Diagnosis not present

## 2011-10-22 DIAGNOSIS — F0390 Unspecified dementia without behavioral disturbance: Secondary | ICD-10-CM | POA: Insufficient documentation

## 2011-10-22 MED ORDER — ALBUTEROL SULFATE HFA 108 (90 BASE) MCG/ACT IN AERS: 2.0000 | INHALATION_SPRAY | Freq: Four times a day (QID) | RESPIRATORY_TRACT | Status: DC | PRN

## 2011-10-22 NOTE — Assessment & Plan Note (Addendum)
Was prescribed Aricept around 2011 by her previous PCP, patient states that she never thought she needed aricept.

## 2011-10-22 NOTE — Patient Instructions (Signed)
Get the records from her previous doctor (need old records, please sign a release of information) Will schedule a test to see about your lung function next visit---> 3 months for a routine checkup Use Ventolin only as needed if you have cough and congestion.

## 2011-10-22 NOTE — Assessment & Plan Note (Signed)
Recommend to discuss neck pain with her orthopedic doctor who knows her well.

## 2011-10-22 NOTE — Progress Notes (Signed)
  Subjective:    Patient ID: Donna Flowers, female    DOB: September 10, 1927, 78 y.o.   MRN: [id number]  HPI Last office visit around 4 years ago, here to get reestablished. During the last 2 years she has seen Dr. Irena Cords --- Micah Flesher to the ER with respiratory symptoms for 10-04-2011, chest x-ray showed no acute process and a question of a rib fracture. She was treated w/ nebulizations and discharged home on prednisone. She is doing much better.  She also has a six-month history of neck pain, right-sided, no radiation, worse when she moves her head to the sides and associated with a grinding noise  Past Medical History: DJD, sees ortho COPD Osteoporosis used to see endocrinology Urinary incontinence Decreased memory, aricept started ~ 2011  Past Surgical History: Right hip replacement x 2 Right ankkle surgeries Right wrist left elbow Hysterectomy: secondary fibroid. 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 Widow, 2 sons , one in [place] and another in [place] Never Smoked Alcohol use-yes Lives by herself, still drives      Review of Systems No fever or chills No sputum production. She reports wheezing and cough  previously but now that is resolved.     Objective:   Physical Exam General -- alert, well-developed, and not overweight appearing. No apparent distress.  Lungs -- normal respiratory effort, no intercostal retractions, no accessory muscle use, and decreased breath sounds.   Heart-- normal rate, regular rhythm, no murmur, and no gallop.   Extremities-- no pretibial edema bilaterally  Neurologic-- alert & oriented X3 and strength normal in all extremities. Psych-- Cognition and judgment appear intact. Alert and cooperative with normal attention span and  concentration.  not anxious appearing and not depressed appearing.       Assessment & Plan:

## 2011-10-22 NOTE — Assessment & Plan Note (Signed)
Has a diagnosis of COPD, seen at the ER recently with an exacerbation, she was never a smoker. Plan: PFTs Albuterol as needed.

## 2011-10-30 ENCOUNTER — Ambulatory Visit (INDEPENDENT_AMBULATORY_CARE_PROVIDER_SITE_OTHER): Payer: Medicare Other | Admitting: Internal Medicine

## 2011-10-30 DIAGNOSIS — J449 Chronic obstructive pulmonary disease, unspecified: Secondary | ICD-10-CM | POA: Diagnosis not present

## 2011-10-30 DIAGNOSIS — J4489 Other specified chronic obstructive pulmonary disease: Secondary | ICD-10-CM

## 2011-10-30 LAB — PULMONARY FUNCTION TEST

## 2011-10-30 NOTE — Progress Notes (Signed)
PFT done today. 

## 2011-11-14 ENCOUNTER — Telehealth: Payer: Self-pay | Admitting: Internal Medicine

## 2011-11-14 NOTE — Telephone Encounter (Signed)
Advise patient, test showed only mild obstruction. Plan is the same

## 2011-11-15 NOTE — Telephone Encounter (Signed)
Discussed with pt

## 2012-01-01 DIAGNOSIS — M5030 Other cervical disc degeneration, unspecified cervical region: Secondary | ICD-10-CM | POA: Diagnosis not present

## 2012-01-14 ENCOUNTER — Ambulatory Visit (INDEPENDENT_AMBULATORY_CARE_PROVIDER_SITE_OTHER): Payer: Medicare Other | Admitting: Internal Medicine

## 2012-01-14 ENCOUNTER — Encounter: Payer: Self-pay | Admitting: Internal Medicine

## 2012-01-14 VITALS — BP 142/78 | HR 75 | Temp 97.6°F | Wt 116.0 lb

## 2012-01-14 DIAGNOSIS — J449 Chronic obstructive pulmonary disease, unspecified: Secondary | ICD-10-CM

## 2012-01-14 DIAGNOSIS — M1990 Unspecified osteoarthritis, unspecified site: Secondary | ICD-10-CM

## 2012-01-14 DIAGNOSIS — F0390 Unspecified dementia without behavioral disturbance: Secondary | ICD-10-CM | POA: Diagnosis not present

## 2012-01-14 MED ORDER — DONEPEZIL HCL 5 MG PO TABS: 5.0000 mg | ORAL_TABLET | Freq: Two times a day (BID) | ORAL | Status: DC

## 2012-01-14 NOTE — Assessment & Plan Note (Addendum)
Suspect neck pain is related to DJD, pain is not affecting her lifestyle except for occasional difficulty sleeping. Declined it to be refer back to orthopedic surgery. Plan: Check x-ray Tylenol at night If pain is worse, she is to let me know Addendum: Spoke with Jonny Ruiz, neck pain has been addressed by orthopedic surgery, got better after a prednisone dose, she was also recommend to use a soft collar at night Plan: Tylenol at night, no need for a x-ray at this point

## 2012-01-14 NOTE — Patient Instructions (Addendum)
Take Tylenol 500 mg 2 tablets every night before you go to bed to help with neck pain ------ Please call your  previous doctor, I haven't seen your old records yet ---------- Go back to Aricept 5 mg: One tablet daily for a month, then one tablet twice a day

## 2012-01-14 NOTE — Assessment & Plan Note (Signed)
She's never a smoker, recent PFTs showed mild disease. She's currently asymptomatic Plan: Observation

## 2012-01-14 NOTE — Assessment & Plan Note (Addendum)
MMSE 17 today, patient has been taking Aricept for around 2 years. I think she has more dementia than what she realizes. I discussed this candidly with the patient. One of my recommendations is to share this with her family Also I recommend to add Namenda. Addendum: I spoke with the patient's son Donna Flowers who was at the wiating ---> . She has been taking Aricept apparently a low dose up until approximately 4 weeks ago. Plan: Restart Aricept to a full dose Reassess in 3 months. Consider add Namenda

## 2012-01-14 NOTE — Progress Notes (Signed)
  Subjective:    Patient ID: Donna Flowers, female    DOB: [birth year], 78 y.o.   MRN: [id number]  HPI Here for a checkup In general doing well. Since the last time she was here, PFTs showed mild disease Continue to complain of neck pain, pain is a steady, no radiation, has only taken over-the-counter medication. Is not interfering with her ADLs but sometimes make it difficult to sleep.  Past Medical History: DJD, sees ortho COPD Osteoporosis used to see endocrinology Urinary incontinence Decreased memory, aricept started ~ 2011  Past Surgical History: Right hip replacement x 2 Right ankkle surgeries Right wrist left elbow Hysterectomy: secondary fibroid. 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 Widow, 2 sons , one in [place] and another in [place]-- son Donna Flowers, phone [phone] Never Smoked Alcohol use-yes Lives by herself, still drives  only short distances   Review of Systems Denies cough, sputum production, shortness of breath. O2 sat are 96%    Objective:   Physical Exam  MMSE performed: 17 Neck: Range of motion is slightly decreased, not particularly tender to palpation. Neurological exam: DTRs symmetric, strength normal.     Assessment & Plan:  Today , I spent more than 25 min with the patient, >50% of the time counseling (see a/p) , and  performing MMSE

## 2012-01-22 ENCOUNTER — Ambulatory Visit: Payer: Medicare Other | Admitting: Internal Medicine

## 2012-01-27 ENCOUNTER — Emergency Department (HOSPITAL_COMMUNITY): Payer: Medicare Other

## 2012-01-27 ENCOUNTER — Emergency Department (HOSPITAL_COMMUNITY)
Admission: EM | Admit: 2012-01-27 | Discharge: 2012-01-27 | Disposition: A | Discharge status: Home / Self Care | Payer: Medicare Other | Attending: Emergency Medicine | Admitting: Emergency Medicine

## 2012-01-27 DIAGNOSIS — J4489 Other specified chronic obstructive pulmonary disease: Secondary | ICD-10-CM | POA: Insufficient documentation

## 2012-01-27 DIAGNOSIS — R6889 Other general symptoms and signs: Secondary | ICD-10-CM | POA: Diagnosis not present

## 2012-01-27 DIAGNOSIS — F0390 Unspecified dementia without behavioral disturbance: Secondary | ICD-10-CM | POA: Diagnosis not present

## 2012-01-27 DIAGNOSIS — J449 Chronic obstructive pulmonary disease, unspecified: Secondary | ICD-10-CM | POA: Diagnosis not present

## 2012-01-27 DIAGNOSIS — M84453A Pathological fracture, unspecified femur, initial encounter for fracture: Secondary | ICD-10-CM | POA: Insufficient documentation

## 2012-01-27 DIAGNOSIS — Z96649 Presence of unspecified artificial hip joint: Secondary | ICD-10-CM | POA: Insufficient documentation

## 2012-01-27 DIAGNOSIS — M810 Age-related osteoporosis without current pathological fracture: Secondary | ICD-10-CM | POA: Insufficient documentation

## 2012-01-27 DIAGNOSIS — S8990XA Unspecified injury of unspecified lower leg, initial encounter: Secondary | ICD-10-CM | POA: Diagnosis not present

## 2012-01-27 DIAGNOSIS — W010XXA Fall on same level from slipping, tripping and stumbling without subsequent striking against object, initial encounter: Secondary | ICD-10-CM | POA: Insufficient documentation

## 2012-01-27 DIAGNOSIS — Z96659 Presence of unspecified artificial knee joint: Secondary | ICD-10-CM | POA: Insufficient documentation

## 2012-01-27 DIAGNOSIS — S99929A Unspecified injury of unspecified foot, initial encounter: Secondary | ICD-10-CM | POA: Insufficient documentation

## 2012-01-27 DIAGNOSIS — M25569 Pain in unspecified knee: Secondary | ICD-10-CM | POA: Diagnosis not present

## 2012-01-27 DIAGNOSIS — M79609 Pain in unspecified limb: Secondary | ICD-10-CM | POA: Diagnosis not present

## 2012-01-27 DIAGNOSIS — Z043 Encounter for examination and observation following other accident: Secondary | ICD-10-CM | POA: Diagnosis not present

## 2012-01-27 MED ORDER — HYDROCODONE-ACETAMINOPHEN 5-500 MG PO TABS: 1.0000 | ORAL_TABLET | Freq: Three times a day (TID) | ORAL | Status: AC | PRN

## 2012-01-27 MED ORDER — HYDROCODONE-ACETAMINOPHEN 5-325 MG PO TABS
1.0000 | ORAL_TABLET | Freq: Once | ORAL | Status: AC
  Administered 2012-01-27: 1 via ORAL
  Filled 2012-01-27: qty 1

## 2012-01-27 NOTE — ED Notes (Signed)
Pt ambulated with 2 assist, applying minimal weight to RLE. Pain focused on distal aspect of knee. Denies pain on either hip at this time. Pt was helped back in the bed. MD aware.

## 2012-01-27 NOTE — ED Notes (Signed)
Per EMS- pt fell while at home with family. States that she was trying to sweep and pet the dog. Hx of dementia. No LOC.

## 2012-01-29 DIAGNOSIS — IMO0002 Reserved for concepts with insufficient information to code with codable children: Secondary | ICD-10-CM | POA: Diagnosis not present

## 2012-01-29 DIAGNOSIS — S8000XA Contusion of unspecified knee, initial encounter: Secondary | ICD-10-CM | POA: Diagnosis not present

## 2012-02-07 NOTE — ED Provider Notes (Signed)
History     CSN: [id number]  Arrival date & time 01/27/12  1804   First MD Initiated Contact with Patient 01/27/12 1817      Chief Complaint  Patient presents with  . Fall     HPI Per EMS- pt fell while at home with family. States that she was trying to sweep and pet the dog. Hx of dementia. No LOC  Past Medical History  Diagnosis Date  . COPD (chronic obstructive pulmonary disease)   . Osteoporosis   . Dementia     Past Surgical History  Procedure Date  . Joint replacement     right hip, left knee  . Ankle surgery     right    No family history on file.  History  Substance Use Topics  . Smoking status: Never Smoker   . Smokeless tobacco: Not on file  . Alcohol Use: 4.2 oz/week    7 Glasses of wine per week    OB History    Grav Para Term Preterm Abortions TAB SAB Ect Mult Living                  Review of Systems  Unable to perform ROS: Dementia    Allergies  Review of patient's allergies indicates no known allergies.  Home Medications   Current Outpatient Rx  Name Route Sig Dispense Refill  . CALCIUM CARBONATE-VITAMIN D 500-200 MG-UNIT PO TABS Oral Take 1 tablet by mouth daily.    . DONEPEZIL HCL 5 MG PO TABS Oral Take 5 mg by mouth 2 (two) times daily.    Marland Kitchen HYDROCODONE-ACETAMINOPHEN 5-500 MG PO TABS Oral Take 1 tablet by mouth every 8 (eight) hours as needed for pain. 15 tablet 0    BP 153/68  Pulse 75  Temp 97.4 F (36.3 C) (Oral)  Resp 16  SpO2 97%  Physical Exam  Nursing note and vitals reviewed. Constitutional: She appears well-developed. No distress.  HENT:  Head: Normocephalic and atraumatic.  Eyes: Pupils are equal, round, and reactive to light.  Neck: Normal range of motion.  Cardiovascular: Normal rate and intact distal pulses.   Pulmonary/Chest: No respiratory distress.  Abdominal: Normal appearance. She exhibits no distension.  Musculoskeletal:       Legs: Neurological: She is alert. No cranial nerve deficit.  Coordination abnormal.  Skin: Skin is warm and dry. No rash noted.  Psychiatric: She has a normal mood and affect. Her behavior is normal.    ED Course  Procedures (including critical care time)  Labs Reviewed - No data to display No results found.  DG Knee Complete 4 Views Right (Final result)   Result time:01/27/12 2049    Final result by Rad Results In Interface (01/27/12 20:49:53)    Narrative:   *RADIOLOGY REPORT*  Clinical Data: Fall. Pain  RIGHT KNEE - COMPLETE 4+ VIEW  Comparison: 02/01/2010  Findings: Chronic fracture of the mid femur. Right hip replacement with a long femoral stem extending into the distal femur. Cerclage wires around mid femur. There is bone graft around the fracture.  The prosthesis projects slightly anterior to the femoral cortex but is surrounded by bone. There was a fracture distal to the prosthesis on the prior study which has healed. There is remodeling around the distal prosthesis which may be due to the old fracture or due to loosening. There is not the typical periprosthetic lucency seen typical of loosening.  Negative for acute fracture.  IMPRESSION: Negative for acute fracture.  Remodeling around  the distal femoral prosthesis may be related to a chronic fracture or loosening around the prosthesis.  Original Report Authenticated By: Camelia Phenes, M.D.            DG Femur Right (Final result)   Result time:01/27/12 [phone]    Final result by Rad Results In Interface (01/27/12 18:51:32)    Narrative:   *RADIOLOGY REPORT*  Clinical Data: Fall. Pain  RIGHT FEMUR - 2 VIEW  Comparison: 02/03/2010  Findings: Chronic fracture of the mid femur has been fixed with a rod and cerclage wires. There has been a right hip replacement.  There is bony remodeling around the distal tip of the femoral prosthesis which may indicate loosening of the prosthesis. This has developed since the prior study.  No acute  fracture  IMPRESSION: Negative for acute fracture.  Suspicion of loosening of the distal prosthesis.  Original Report Authenticated By: Camelia Phenes, M.D     1. Knee injury       MDM   Discussed with orthopedist. Out patient follow up ok'd if patient can ambulate.       Nelia Shi, MD 02/07/12 [phone]

## 2012-02-20 ENCOUNTER — Other Ambulatory Visit: Payer: Self-pay | Admitting: Internal Medicine

## 2012-02-20 NOTE — Telephone Encounter (Signed)
Refill done.  

## 2012-03-06 ENCOUNTER — Telehealth: Payer: Self-pay | Admitting: Internal Medicine

## 2012-03-06 NOTE — Telephone Encounter (Signed)
Old records reviewed, summarized below, some records will be sent to be scan , others will return to the patient for safe keeping RPR -2009 Chest x-ray 2010 showedCOPD Osteoporosis, status post Forteo for 2 years, was recommended to start Fosamax 12-2009 DEXA 2009 T score -2.5 DEXA   11/09/2009  T score -2.6 History of low vitamin D, it was 23 on 03/22/2011 History of LBBB 03/22/2011 total cholesterol 258, HDL 103, LDL 141.

## 2012-04-15 ENCOUNTER — Ambulatory Visit (INDEPENDENT_AMBULATORY_CARE_PROVIDER_SITE_OTHER): Payer: Medicare Other | Admitting: Internal Medicine

## 2012-04-15 VITALS — BP 144/76 | HR 75 | Temp 97.6°F | Wt 116.0 lb

## 2012-04-15 DIAGNOSIS — M1990 Unspecified osteoarthritis, unspecified site: Secondary | ICD-10-CM

## 2012-04-15 DIAGNOSIS — Z23 Encounter for immunization: Secondary | ICD-10-CM | POA: Diagnosis not present

## 2012-04-15 DIAGNOSIS — F0390 Unspecified dementia without behavioral disturbance: Secondary | ICD-10-CM | POA: Diagnosis not present

## 2012-04-15 DIAGNOSIS — M810 Age-related osteoporosis without current pathological fracture: Secondary | ICD-10-CM | POA: Diagnosis not present

## 2012-04-15 NOTE — Assessment & Plan Note (Signed)
Since the last time she was here, the things that she is doing different are:  increased Aricept dose, vitamin D supplementation and she stopped  drinking a glasses of wine at night. Overall, doing better. Plan: Continue with present care, avoid wine

## 2012-04-15 NOTE — Progress Notes (Signed)
  Subjective:    Patient ID: Donna Flowers, female    DOB: 07-19-1927, 78 y.o.   MRN: [id number]  HPI Followup from previous visit, I obtained most of the information from Jonny Ruiz, her son.  Past Medical History: DJD, sees ortho COPD Osteoporosis used to see endocrinology Urinary incontinence Decreased memory, aricept started ~ 2011  Past Surgical History: Right hip replacement x 2 Right ankkle surgeries Right wrist left elbow Hysterectomy: secondary fibroid. 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 Widow, 2 sons , one in [place] and another in [place]-- son Merrisa Skorupski, phone [phone] Never Smoked Alcohol use- no further sibce ~ 02-2012 Lives by herself, still drives  only short distances    Review of Systems Since the last time she was here, she is taking calcium and vitamin D as well as Aricept. She also quit drinking wine. John reports that she is much better, less confusion, she has more of a spark. He reports the patient is not complaining of nausea, vomiting, diarrhea. No headaches, fever or chills. No weight loss. She continue with neck pain.    Objective:   Physical Exam  General -- alert, well-developed, and well-nourished.    Lungs -- normal respiratory effort, no intercostal retractions, no accessory muscle use, and normal breath sounds.   Heart-- normal rate, regular rhythm, no murmur, and no gallop.   Extremities-- no pretibial edema bilaterally Neurologic-- alert , no formal exam done Psych--  not anxious appearing and not depressed appearing.       Assessment & Plan:  Today , I spent more than 25  min with the patient and her son,  >50% of the time counseling-- We discussed her previous bone density tests, treatment options including  oral versus parenteral diphosphonates. We also discussed the risk of fractures I tried to respond all  questions to the best of my ability

## 2012-04-15 NOTE — Assessment & Plan Note (Addendum)
Continue with some neck pain. No actual headache, fevers or weight loss. Recommend to be reassessed by orthopedic surgery. at some point they were considering a local ejection. Also see instructions regards Tylenol.

## 2012-04-15 NOTE — Assessment & Plan Note (Addendum)
Old records were reviewed, Osteoporosis, status post Forteo for 2 years, was recommended to start Fosamax 12-2009  DEXA 2009 T score -2.5  DEXA 11/09/2009 T score -2.6  History of low vitamin D, it was 23 on 03/22/2011 The patient did take Fosamax few times but self discontinued due to the potential for side effects she read about it. Plan:  Schedule a bone density test Consider repeat fosamax  versus other biphosphonates Continue with calcium and vitamin D

## 2012-04-15 NOTE — Patient Instructions (Addendum)
Please see Dr. Shelle Iron, orthopedic surgery about the neck. Tylenol  500 mg OTC 2 tabs a day every 8 hours as needed for pain  Come back in 4 months, fasting.

## 2012-04-16 ENCOUNTER — Encounter: Payer: Self-pay | Admitting: Internal Medicine

## 2012-05-19 ENCOUNTER — Ambulatory Visit (INDEPENDENT_AMBULATORY_CARE_PROVIDER_SITE_OTHER)
Admission: RE | Admit: 2012-05-19 | Discharge: 2012-05-19 | Disposition: A | Discharge status: Home / Self Care | Payer: Medicare Other | Source: Ambulatory Visit

## 2012-05-19 DIAGNOSIS — M810 Age-related osteoporosis without current pathological fracture: Secondary | ICD-10-CM | POA: Diagnosis not present

## 2012-06-22 ENCOUNTER — Other Ambulatory Visit: Payer: Self-pay | Admitting: Internal Medicine

## 2012-06-23 NOTE — Telephone Encounter (Signed)
Refill done.  

## 2012-08-12 ENCOUNTER — Ambulatory Visit: Payer: Medicare Other | Admitting: Internal Medicine

## 2012-08-15 ENCOUNTER — Ambulatory Visit: Payer: Medicare Other | Admitting: Internal Medicine

## 2012-08-22 ENCOUNTER — Telehealth: Payer: Self-pay | Admitting: *Deleted

## 2012-08-22 NOTE — Telephone Encounter (Signed)
Prior Auth approved 08-22-12 until 06-10-13, pharmacy faxed

## 2012-08-28 ENCOUNTER — Ambulatory Visit: Payer: Medicare Other | Admitting: Internal Medicine

## 2012-08-29 ENCOUNTER — Other Ambulatory Visit: Payer: Self-pay | Admitting: *Deleted

## 2012-08-29 MED ORDER — DONEPEZIL HCL 5 MG PO TABS: ORAL_TABLET | ORAL | Status: DC

## 2012-08-29 NOTE — Telephone Encounter (Signed)
Refill for Aricept sent to [facility] in [place] per pt request

## 2012-09-01 ENCOUNTER — Ambulatory Visit (INDEPENDENT_AMBULATORY_CARE_PROVIDER_SITE_OTHER): Payer: Medicare Other | Admitting: Internal Medicine

## 2012-09-01 ENCOUNTER — Encounter: Payer: Self-pay | Admitting: Internal Medicine

## 2012-09-01 VITALS — BP 134/72 | HR 74 | Temp 97.8°F | Wt 119.0 lb

## 2012-09-01 DIAGNOSIS — F0390 Unspecified dementia without behavioral disturbance: Secondary | ICD-10-CM

## 2012-09-01 DIAGNOSIS — M1990 Unspecified osteoarthritis, unspecified site: Secondary | ICD-10-CM | POA: Diagnosis not present

## 2012-09-01 DIAGNOSIS — J449 Chronic obstructive pulmonary disease, unspecified: Secondary | ICD-10-CM | POA: Diagnosis not present

## 2012-09-01 DIAGNOSIS — M810 Age-related osteoporosis without current pathological fracture: Secondary | ICD-10-CM | POA: Diagnosis not present

## 2012-09-01 MED ORDER — IBANDRONATE SODIUM 150 MG PO TABS: 150.0000 mg | ORAL_TABLET | ORAL | Status: DC

## 2012-09-01 MED ORDER — BUDESONIDE-FORMOTEROL FUMARATE 80-4.5 MCG/ACT IN AERO: 2.0000 | INHALATION_SPRAY | Freq: Two times a day (BID) | RESPIRATORY_TRACT | Status: DC

## 2012-09-01 NOTE — Assessment & Plan Note (Addendum)
Will start Symbicort twice a day during the spring which  is the time of the year   she has most of the symptoms.

## 2012-09-01 NOTE — Progress Notes (Signed)
  Subjective:    Patient ID: Donna Flowers, female    DOB: 10-31-1927, 79 y.o.   MRN: [id number]  HPI Routine office visit, most of the information discussed and reviewed with Jonny Ruiz, pt's son DJD, continue with neck pain as before. Dementia, Jonny Ruiz  reports she is stable, having mostly good days, "as long as she has her newspaper in the morning she is happy". COPD, reportedly has cough and wheeze often during the spring, would like something to prevent the symptoms. Osteoporosis, we review the bone density test, she needs treatment, Sandrea Hammond is a good option.   Past Medical History: DJD, sees ortho COPD Osteoporosis used to see endocrinology Urinary incontinence Decreased memory, aricept started ~ 2011  Past Surgical History: Right hip replacement x 2 Right ankkle surgeries Right wrist left elbow Hysterectomy: secondary fibroid. 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 Widow, 2 sons , one in [place] and another in [place]-- son Cathi Hazan, phone [phone] Never Smoked Alcohol use- no further sibce ~ 02-2012 Lives by herself, still drives  only short distances    Review of Systems No recent falls. Currently without cough or wheezing. No  nausea, vomiting, diarrhea.    Objective:   Physical Exam Vital signs stable. Weight, has gained a couple of pounds. General -- alert, well-developed  Lungs -- normal respiratory effort, no intercostal retractions, no accessory muscle use, and decreased breath sounds.   Heart-- normal rate, regular rhythm, no murmur, and no gallop.   Lower extremities without edema Psych-- pleasently demented, not anxious appearing and not depressed appearing.      Assessment & Plan:

## 2012-09-01 NOTE — Assessment & Plan Note (Addendum)
See previous entry, I think Donna Flowers  is a very good option for the patient, patient's son agrees. Last BMP normal. I also took about importance of fall prevention. Plan:  Start Boniva, precautions discussed  Cont vit D and Ca Labs on RTC

## 2012-09-01 NOTE — Assessment & Plan Note (Signed)
Stable, mostly having good days.

## 2012-09-01 NOTE — Patient Instructions (Addendum)
Take symbicort 2 puffs twice a day during spring to prevent cough-wheezing Boniva one a month, on a empty stomach with 2 glasses of water, do not go back to bed or eat in 30-40 minutes     Fall Prevention and Home Safety Falls cause injuries and can affect all age groups. It is possible to use preventive measures to significantly decrease the likelihood of falls. There are many simple measures which can make your home safer and prevent falls. OUTDOORS  Repair cracks and edges of walkways and driveways.  Remove high doorway thresholds.  Trim shrubbery on the main path into your home.  Have good outside lighting.  Clear walkways of tools, rocks, debris, and clutter.  Check that handrails are not broken and are securely fastened. Both sides of steps should have handrails.  Have leaves, snow, and ice cleared regularly.  Use sand or salt on walkways during winter months.  In the garage, clean up grease or oil spills. BATHROOM  Install night lights.  Install grab bars by the toilet and in the tub and shower.  Use non-skid mats or decals in the tub or shower.  Place a plastic non-slip stool in the shower to sit on, if needed.  Keep floors dry and clean up all water on the floor immediately.  Remove soap buildup in the tub or shower on a regular basis.  Secure bath mats with non-slip, double-sided rug tape.  Remove throw rugs and tripping hazards from the floors. BEDROOMS  Install night lights.  Make sure a bedside light is easy to reach.  Do not use oversized bedding.  Keep a telephone by your bedside.  Have a firm chair with side arms to use for getting dressed.  Remove throw rugs and tripping hazards from the floor. KITCHEN  Keep handles on pots and pans turned toward the center of the stove. Use back burners when possible.  Clean up spills quickly and allow time for drying.  Avoid walking on wet floors.  Avoid hot utensils and knives.  Position shelves so  they are not too high or low.  Place commonly used objects within easy reach.  If necessary, use a sturdy step stool with a grab bar when reaching.  Keep electrical cables out of the way.  Do not use floor polish or wax that makes floors slippery. If you must use wax, use non-skid floor wax.  Remove throw rugs and tripping hazards from the floor. STAIRWAYS  Never leave objects on stairs.  Place handrails on both sides of stairways and use them. Fix any loose handrails. Make sure handrails on both sides of the stairways are as long as the stairs.  Check carpeting to make sure it is firmly attached along stairs. Make repairs to worn or loose carpet promptly.  Avoid placing throw rugs at the top or bottom of stairways, or properly secure the rug with carpet tape to prevent slippage. Get rid of throw rugs, if possible.  Have an electrician put in a light switch at the top and bottom of the stairs. OTHER FALL PREVENTION TIPS  Wear low-heel or rubber-soled shoes that are supportive and fit well. Wear closed toe shoes.  When using a stepladder, make sure it is fully opened and both spreaders are firmly locked. Do not climb a closed stepladder.  Add color or contrast paint or tape to grab bars and handrails in your home. Place contrasting color strips on first and last steps.  Learn and use mobility aids as  needed. Install an electrical emergency response system.  Turn on lights to avoid dark areas. Replace light bulbs that burn out immediately. Get light switches that glow.  Arrange furniture to create clear pathways. Keep furniture in the same place.  Firmly attach carpet with non-skid or double-sided tape.  Eliminate uneven floor surfaces.  Select a carpet pattern that does not visually hide the edge of steps.  Be aware of all pets. OTHER HOME SAFETY TIPS  Set the water temperature for 120 F (48.8 C).  Keep emergency numbers on or near the telephone.  Keep smoke  detectors on every level of the home and near sleeping areas. Document Released: 05/18/2002 Document Revised: 11/27/2011 Document Reviewed: 08/17/2011 [facility] Patient Information 2013 [place], [place].

## 2012-09-01 NOTE — Assessment & Plan Note (Signed)
Patient again reports right neck pain, recommend Tylenol twice a day and a warm compress.

## 2012-11-10 ENCOUNTER — Other Ambulatory Visit: Payer: Self-pay | Admitting: Internal Medicine

## 2012-11-10 NOTE — Telephone Encounter (Signed)
Refill done.  

## 2013-03-03 ENCOUNTER — Telehealth: Payer: Self-pay

## 2013-03-03 NOTE — Telephone Encounter (Signed)
LVM for son Jonny Ruiz on [place]) to call back. Due for flu vaccine No documentation of Zostavax Colonoscopy current Dexa current.

## 2013-03-04 ENCOUNTER — Encounter: Payer: Self-pay | Admitting: Internal Medicine

## 2013-03-04 ENCOUNTER — Ambulatory Visit (INDEPENDENT_AMBULATORY_CARE_PROVIDER_SITE_OTHER): Payer: Medicare Other | Admitting: Internal Medicine

## 2013-03-04 VITALS — BP 167/72 | HR 82 | Temp 97.7°F | Ht 63.3 in | Wt 125.6 lb

## 2013-03-04 DIAGNOSIS — J4489 Other specified chronic obstructive pulmonary disease: Secondary | ICD-10-CM

## 2013-03-04 DIAGNOSIS — J449 Chronic obstructive pulmonary disease, unspecified: Secondary | ICD-10-CM

## 2013-03-04 DIAGNOSIS — Z0000 Encounter for general adult medical examination without abnormal findings: Secondary | ICD-10-CM | POA: Diagnosis not present

## 2013-03-04 DIAGNOSIS — R413 Other amnesia: Secondary | ICD-10-CM | POA: Diagnosis not present

## 2013-03-04 DIAGNOSIS — M810 Age-related osteoporosis without current pathological fracture: Secondary | ICD-10-CM

## 2013-03-04 DIAGNOSIS — F0390 Unspecified dementia without behavioral disturbance: Secondary | ICD-10-CM

## 2013-03-04 LAB — CBC WITH DIFFERENTIAL/PLATELET
Basophils Absolute: 0 10*3/uL (ref 0.0–0.1)
Eosinophils Absolute: 0.1 10*3/uL (ref 0.0–0.7)
Hemoglobin: 13.9 g/dL (ref 12.0–15.0)
Lymphocytes Relative: 14.6 % (ref 12.0–46.0)
MCHC: 32.9 g/dL (ref 30.0–36.0)
Monocytes Relative: 6.1 % (ref 3.0–12.0)
Neutro Abs: 5.1 10*3/uL (ref 1.4–7.7)
Platelets: 182 10*3/uL (ref 150.0–400.0)
RDW: 14.4 % (ref 11.5–14.6)

## 2013-03-04 LAB — COMPREHENSIVE METABOLIC PANEL
ALT: 28 U/L (ref 0–35)
AST: 37 U/L (ref 0–37)
Albumin: 3.8 g/dL (ref 3.5–5.2)
CO2: 30 mEq/L (ref 19–32)
Calcium: 9.5 mg/dL (ref 8.4–10.5)
Chloride: 107 mEq/L (ref 96–112)
Creatinine, Ser: 0.7 mg/dL (ref 0.4–1.2)
GFR: 84.97 mL/min (ref >60.00)
Potassium: 3.5 mEq/L (ref 3.5–5.1)
Sodium: 142 mEq/L (ref 135–145)
Total Protein: 6.7 g/dL (ref 6.0–8.3)

## 2013-03-04 LAB — TSH: TSH: 1.2 u[IU]/mL (ref 0.35–5.50)

## 2013-03-04 MED ORDER — ZOSTER VACCINE LIVE 19400 UNT/0.65ML ~~LOC~~ SOLR: 0.6500 mL | Freq: Once | SUBCUTANEOUS | Status: DC

## 2013-03-04 NOTE — Telephone Encounter (Signed)
Unable to reach for pre visit planning.

## 2013-03-04 NOTE — Assessment & Plan Note (Addendum)
Td 2008, pnm shot 2013 zostavax discussed, Rx provided  Needs flu shot , high dose  Last MMG 2006 (-), no recent PAP, Cscope 2009-- rec no further screening, pt in agreement Return to the office in 6 months. All care was also discussed with her son

## 2013-03-04 NOTE — Assessment & Plan Note (Addendum)
Patient with dementia but highly functional. Situation discussed with her son--> as long as she is doing well and is safe at home will  continue with present care

## 2013-03-04 NOTE — Patient Instructions (Addendum)
Get your blood work before you leave : CMP, CBC, TSH, vitamins D., B12, folic acid, RPR: DX--- dementia, screening for vitamin deficiency, Osteoporosis ----- Next visit in 6 months   for a check up --- Please get a high dose showed at the pharmacy or  here in 3 weeks I recommend you to have a Zostavax (the shingles shot), see prescription ----  Fall Prevention and Home Safety Falls cause injuries and can affect all age groups. It is possible to use preventive measures to significantly decrease the likelihood of falls. There are many simple measures which can make your home safer and prevent falls. OUTDOORS  Repair cracks and edges of walkways and driveways.  Remove high doorway thresholds.  Trim shrubbery on the main path into your home.  Have good outside lighting.  Clear walkways of tools, rocks, debris, and clutter.  Check that handrails are not broken and are securely fastened. Both sides of steps should have handrails.  Have leaves, snow, and ice cleared regularly.  Use sand or salt on walkways during winter months.  In the garage, clean up grease or oil spills. BATHROOM  Install night lights.  Install grab bars by the toilet and in the tub and shower.  Use non-skid mats or decals in the tub or shower.  Place a plastic non-slip stool in the shower to sit on, if needed.  Keep floors dry and clean up all water on the floor immediately.  Remove soap buildup in the tub or shower on a regular basis.  Secure bath mats with non-slip, double-sided rug tape.  Remove throw rugs and tripping hazards from the floors. BEDROOMS  Install night lights.  Make sure a bedside light is easy to reach.  Do not use oversized bedding.  Keep a telephone by your bedside.  Have a firm chair with side arms to use for getting dressed.  Remove throw rugs and tripping hazards from the floor. KITCHEN  Keep handles on pots and pans turned toward the center of the stove. Use back  burners when possible.  Clean up spills quickly and allow time for drying.  Avoid walking on wet floors.  Avoid hot utensils and knives.  Position shelves so they are not too high or low.  Place commonly used objects within easy reach.  If necessary, use a sturdy step stool with a grab bar when reaching.  Keep electrical cables out of the way.  Do not use floor polish or wax that makes floors slippery. If you must use wax, use non-skid floor wax.  Remove throw rugs and tripping hazards from the floor. STAIRWAYS  Never leave objects on stairs.  Place handrails on both sides of stairways and use them. Fix any loose handrails. Make sure handrails on both sides of the stairways are as long as the stairs.  Check carpeting to make sure it is firmly attached along stairs. Make repairs to worn or loose carpet promptly.  Avoid placing throw rugs at the top or bottom of stairways, or properly secure the rug with carpet tape to prevent slippage. Get rid of throw rugs, if possible.  Have an electrician put in a light switch at the top and bottom of the stairs. OTHER FALL PREVENTION TIPS  Wear low-heel or rubber-soled shoes that are supportive and fit well. Wear closed toe shoes.  When using a stepladder, make sure it is fully opened and both spreaders are firmly locked. Do not climb a closed stepladder.  Add color or contrast paint  or tape to grab bars and handrails in your home. Place contrasting color strips on first and last steps.  Learn and use mobility aids as needed. Install an electrical emergency response system.  Turn on lights to avoid dark areas. Replace light bulbs that burn out immediately. Get light switches that glow.  Arrange furniture to create clear pathways. Keep furniture in the same place.  Firmly attach carpet with non-skid or double-sided tape.  Eliminate uneven floor surfaces.  Select a carpet pattern that does not visually hide the edge of steps.  Be  aware of all pets. OTHER HOME SAFETY TIPS  Set the water temperature for 120 F (48.8 C).  Keep emergency numbers on or near the telephone.  Keep smoke detectors on every level of the home and near sleeping areas. Document Released: 05/18/2002 Document Revised: 11/27/2011 Document Reviewed: 08/17/2011 [employer] Patient Information 2014 [place], [place].

## 2013-03-04 NOTE — Progress Notes (Signed)
  Subjective:    Patient ID: Donna Flowers, female    DOB: 22-Dec-1927, 79 y.o.   MRN: [id number]  HPI Here for Medicare AWV:  1. Risk factors based on Past M, S, F history: reviewed 2. Physical Activities:  Active with  Home chores 3. Depression/mood: neg screening  4. Hearing: No problemss noted or reported  5. ADL's:  Still drives, short distances, able to self care except needs help w/ paper work-fiances 6. Fall Risk: no recent falls , precautions discussed  7. home Safety: does feel safe at home  8. Height, weight, & visual acuity: see VS, sees the eye doctor 9. Counseling: provided 10. Labs ordered based on risk factors: if needed  11. Referral Coordination: if needed 12. Care Plan, see assessment and plan  13. Cognitive Assessment: Frail lady, motor skills and cognition quite limited.  In addition, today we discussed the following: Osteoporosis, reports good compliance with calcium, vitamin D and Boniva COPD,  uses her inhalers regularly. Dementia, on Aricept,  see assessment and plan   Past Medical History: DJD, sees ortho COPD Osteoporosis used to see endocrinology Urinary incontinence Decreased memory, aricept started ~ 2011 Skin ca-- [facility]  Past Surgical History: Right hip replacement x 2 Right ankkle surgeries Right wrist left elbow Hysterectomy: secondary fibroid. 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 Widow, 2 sons , one in [place] and another in [place]-- son Sinclair Arrazola, phone [phone] Never Smoked Alcohol use- no further since ~ 02-2012 Lives by herself, still drives  only short distances     Review of Systems No  CP, SOB, lower extremity edema No nausea, vomiting diarrhea No blood in the stools No  cough, sputum production No wheezing, chest congestion No dysuria, gross hematuria, difficulty urinating   No Vag bleeding or rash       Objective:   Physical Exam BP 167/72  Pulse 82  Temp(Src) 97.7 F (36.5 C)  Ht 5' 3.3" (1.608 m)  Wt 125 lb 9.6 oz (56.972 kg)  BMI 22.03 kg/m2  SpO2 98% General -- alert, well-developed, Frail 79 year old female, gait: Use a cane and seems slightly unsteady Neck --no thyromegaly , normal carotid pulse   Lungs -- normal respiratory effort, no intercostal retractions, no accessory muscle use, and normal breath sounds.  Heart-- normal rate, regular rhythm, no murmur.  Abdomen-- Not distended, good bowel sounds,soft, non-tender. No mass. No rebound or rigidity. Extremities-- no pretibial edema bilaterally  Neurologic--  alert , not oriented in time, oriented to self, she knows she is in the office and what the purpose of the visit is . Speech normal,  strength normal in all extremities.   Psych--  No anxious appearing , no depressed appearing.      Assessment & Plan:

## 2013-03-04 NOTE — Assessment & Plan Note (Signed)
Asymptomatic, continue Symbicort

## 2013-03-05 LAB — RPR

## 2013-03-09 ENCOUNTER — Telehealth: Payer: Self-pay | Admitting: *Deleted

## 2013-03-09 LAB — VITAMIN D 1,25 DIHYDROXY: Vitamin D3 1, 25 (OH)2: 71 pg/mL

## 2013-03-09 NOTE — Telephone Encounter (Signed)
Spoke with pts son, advised that all labs looked great and to follow up as needed.

## 2013-03-09 NOTE — Telephone Encounter (Signed)
Message copied by Baldwin [place] on Mon Mar 09, 2013  4:16 PM ------      Message from: Willow Ora E      Created: Mon Mar 09, 2013  8:43 AM       Please call the patient's son Megahn Killings, phone [phone]      Avani results are very good, normal liver, kidney, potassium and thyroid function. Vitamins are great. Good results! ------

## 2013-05-01 ENCOUNTER — Encounter: Payer: Self-pay | Admitting: Family Medicine

## 2013-05-01 ENCOUNTER — Ambulatory Visit (INDEPENDENT_AMBULATORY_CARE_PROVIDER_SITE_OTHER): Payer: Medicare Other | Admitting: Family Medicine

## 2013-05-01 VITALS — BP 158/70 | HR 74 | Resp 17 | Wt 123.4 lb

## 2013-05-01 DIAGNOSIS — J309 Allergic rhinitis, unspecified: Secondary | ICD-10-CM

## 2013-05-01 MED ORDER — FLUTICASONE PROPIONATE 50 MCG/ACT NA SUSP: 2.0000 | Freq: Every day | NASAL | Status: DC

## 2013-05-01 MED ORDER — BENZONATATE 200 MG PO CAPS: 200.0000 mg | ORAL_CAPSULE | Freq: Three times a day (TID) | ORAL | Status: DC | PRN

## 2013-05-01 NOTE — Progress Notes (Signed)
  Subjective:    Patient ID: Donna Flowers, female    DOB: 02-Sep-1927, 79 y.o.   MRN: [id number]  HPI Pre visit review using our clinic review tool, if applicable. No additional management support is needed unless otherwise documented below in the visit note.  Cough- pt reports sxs x4-6 weeks.  Pt denies feeling poorly.  Cough is wet but not productive.  No fever.  Denies nasal congestion or sinus pain.  Denies SOB.  Has hx of COPD, not currently on Symbicort.  Denies chest tightness or wheezing.   Review of Systems For ROS see HPI     Objective:   Physical Exam  Vitals reviewed. Constitutional: She appears well-developed and well-nourished. No distress.  HENT:  Head: Normocephalic and atraumatic.  Right Ear: Tympanic membrane normal.  Left Ear: Tympanic membrane normal.  Nose: Mucosal edema and rhinorrhea present. Right sinus exhibits no maxillary sinus tenderness and no frontal sinus tenderness. Left sinus exhibits no maxillary sinus tenderness and no frontal sinus tenderness.  Mouth/Throat: Mucous membranes are normal. Posterior oropharyngeal erythema (w/ PND) present.  Eyes: Conjunctivae and EOM are normal. Pupils are equal, round, and reactive to light.  Neck: Normal range of motion. Neck supple.  Cardiovascular: Normal rate, regular rhythm and normal heart sounds.   Pulmonary/Chest: Effort normal and breath sounds normal. No respiratory distress. She has no wheezes. She has no rales.  Wet, hacking cough  Lymphadenopathy:    She has no cervical adenopathy.          Assessment & Plan:

## 2013-05-01 NOTE — Patient Instructions (Signed)
Follow up as needed Your cough is due to post-nasal drip and allergies Start OTC Claritin or Zyrtec daily Drink plenty of fluids Use the Flonase- 2 sprays each nostril daily Use the Tessalon as needed for cough REST! Call with any questions or concerns Hang in there! Happy Holidays!

## 2013-05-01 NOTE — Assessment & Plan Note (Signed)
New.  Pt's cough is most likely due to the copious PND and untreated nasal allergies.  Normal lung exam.  Start daily antihistamine, nasal steroid.  Cough meds prn.  Reviewed supportive care and red flags that should prompt return.  Pt expressed understanding and is in agreement w/ plan.

## 2013-05-01 NOTE — Addendum Note (Signed)
Addended by: Sheliah Hatch on: 05/01/2013 02:30 PM   Modules accepted: Orders

## 2013-05-08 ENCOUNTER — Other Ambulatory Visit: Payer: Self-pay | Admitting: Internal Medicine

## 2013-05-08 NOTE — Telephone Encounter (Signed)
Donepezil refilled per protocol

## 2013-05-28 ENCOUNTER — Ambulatory Visit: Payer: Medicare Other

## 2013-05-29 DIAGNOSIS — Z23 Encounter for immunization: Secondary | ICD-10-CM | POA: Diagnosis not present

## 2013-06-11 LAB — HM MAMMOGRAPHY: HM MAMMO: NORMAL

## 2013-09-07 ENCOUNTER — Ambulatory Visit: Payer: Medicare Other | Admitting: Internal Medicine

## 2013-09-07 DIAGNOSIS — Z0289 Encounter for other administrative examinations: Secondary | ICD-10-CM

## 2013-09-22 ENCOUNTER — Inpatient Hospital Stay (HOSPITAL_COMMUNITY): Payer: Medicare Other

## 2013-09-22 ENCOUNTER — Ambulatory Visit (INDEPENDENT_AMBULATORY_CARE_PROVIDER_SITE_OTHER): Payer: Medicare Other | Admitting: Internal Medicine

## 2013-09-22 ENCOUNTER — Encounter (HOSPITAL_COMMUNITY): Payer: Self-pay | Admitting: *Deleted

## 2013-09-22 ENCOUNTER — Inpatient Hospital Stay (HOSPITAL_COMMUNITY)
Admission: AD | Admit: 2013-09-22 | Discharge: 2013-09-28 | DRG: 091 | Disposition: A | Discharge status: Home Health | Payer: Medicare Other | Source: Ambulatory Visit | Attending: Internal Medicine | Admitting: Internal Medicine

## 2013-09-22 VITALS — BP 172/74 | HR 85 | Temp 99.9°F | Wt 123.0 lb

## 2013-09-22 DIAGNOSIS — A498 Other bacterial infections of unspecified site: Secondary | ICD-10-CM | POA: Diagnosis present

## 2013-09-22 DIAGNOSIS — Z66 Do not resuscitate: Secondary | ICD-10-CM | POA: Diagnosis present

## 2013-09-22 DIAGNOSIS — J129 Viral pneumonia, unspecified: Secondary | ICD-10-CM | POA: Diagnosis present

## 2013-09-22 DIAGNOSIS — N390 Urinary tract infection, site not specified: Secondary | ICD-10-CM | POA: Diagnosis not present

## 2013-09-22 DIAGNOSIS — F0390 Unspecified dementia without behavioral disturbance: Secondary | ICD-10-CM | POA: Diagnosis not present

## 2013-09-22 DIAGNOSIS — R509 Fever, unspecified: Secondary | ICD-10-CM | POA: Diagnosis present

## 2013-09-22 DIAGNOSIS — G929 Unspecified toxic encephalopathy: Secondary | ICD-10-CM | POA: Diagnosis present

## 2013-09-22 DIAGNOSIS — R059 Cough, unspecified: Secondary | ICD-10-CM

## 2013-09-22 DIAGNOSIS — J159 Unspecified bacterial pneumonia: Secondary | ICD-10-CM | POA: Diagnosis present

## 2013-09-22 DIAGNOSIS — J42 Unspecified chronic bronchitis: Secondary | ICD-10-CM | POA: Diagnosis not present

## 2013-09-22 DIAGNOSIS — Z79899 Other long term (current) drug therapy: Secondary | ICD-10-CM | POA: Diagnosis not present

## 2013-09-22 DIAGNOSIS — J4489 Other specified chronic obstructive pulmonary disease: Secondary | ICD-10-CM | POA: Diagnosis present

## 2013-09-22 DIAGNOSIS — G92 Toxic encephalopathy: Secondary | ICD-10-CM | POA: Diagnosis present

## 2013-09-22 DIAGNOSIS — R05 Cough: Secondary | ICD-10-CM

## 2013-09-22 DIAGNOSIS — J449 Chronic obstructive pulmonary disease, unspecified: Secondary | ICD-10-CM | POA: Diagnosis present

## 2013-09-22 DIAGNOSIS — Z0000 Encounter for general adult medical examination without abnormal findings: Secondary | ICD-10-CM

## 2013-09-22 DIAGNOSIS — Z96659 Presence of unspecified artificial knee joint: Secondary | ICD-10-CM | POA: Diagnosis not present

## 2013-09-22 DIAGNOSIS — G9341 Metabolic encephalopathy: Secondary | ICD-10-CM | POA: Diagnosis not present

## 2013-09-22 DIAGNOSIS — J189 Pneumonia, unspecified organism: Secondary | ICD-10-CM | POA: Diagnosis not present

## 2013-09-22 DIAGNOSIS — R6889 Other general symptoms and signs: Secondary | ICD-10-CM | POA: Diagnosis not present

## 2013-09-22 DIAGNOSIS — Z96649 Presence of unspecified artificial hip joint: Secondary | ICD-10-CM

## 2013-09-22 DIAGNOSIS — R32 Unspecified urinary incontinence: Secondary | ICD-10-CM

## 2013-09-22 DIAGNOSIS — M810 Age-related osteoporosis without current pathological fracture: Secondary | ICD-10-CM | POA: Diagnosis present

## 2013-09-22 DIAGNOSIS — G928 Other toxic encephalopathy: Secondary | ICD-10-CM

## 2013-09-22 DIAGNOSIS — R410 Disorientation, unspecified: Secondary | ICD-10-CM

## 2013-09-22 DIAGNOSIS — M1990 Unspecified osteoarthritis, unspecified site: Secondary | ICD-10-CM

## 2013-09-22 DIAGNOSIS — J309 Allergic rhinitis, unspecified: Secondary | ICD-10-CM

## 2013-09-22 HISTORY — DX: Other complications of anesthesia, initial encounter: T88.59XA

## 2013-09-22 HISTORY — DX: Adverse effect of unspecified anesthetic, initial encounter: T41.45XA

## 2013-09-22 LAB — CBC WITH DIFFERENTIAL/PLATELET
Basophils Absolute: 0 10*3/uL (ref 0.0–0.1)
Basophils Relative: 0 % (ref 0–1)
EOS ABS: 0 10*3/uL (ref 0.0–0.7)
EOS PCT: 0 % (ref 0–5)
HEMATOCRIT: 40.4 % (ref 36.0–46.0)
HEMOGLOBIN: 13.9 g/dL (ref 12.0–15.0)
LYMPHS ABS: 0.6 10*3/uL — AB (ref 0.7–4.0)
LYMPHS PCT: 6 % — AB (ref 12–46)
MCH: 29.4 pg (ref 26.0–34.0)
MCHC: 34.4 g/dL (ref 30.0–36.0)
MCV: 85.6 fL (ref 78.0–100.0)
MONO ABS: 0.8 10*3/uL (ref 0.1–1.0)
MONOS PCT: 8 % (ref 3–12)
Neutro Abs: 8.2 10*3/uL — ABNORMAL HIGH (ref 1.7–7.7)
Neutrophils Relative %: 86 % — ABNORMAL HIGH (ref 43–77)
Platelets: 145 10*3/uL — ABNORMAL LOW (ref 150–400)
RBC: 4.72 MIL/uL (ref 3.87–5.11)
RDW: 14.1 % (ref 11.5–15.5)
WBC: 9.6 10*3/uL (ref 4.0–10.5)

## 2013-09-22 LAB — LACTIC ACID, PLASMA: LACTIC ACID, VENOUS: 2 mmol/L (ref 0.5–2.2)

## 2013-09-22 LAB — STREP PNEUMONIAE URINARY ANTIGEN: Strep Pneumo Urinary Antigen: NEGATIVE

## 2013-09-22 LAB — URINE MICROSCOPIC-ADD ON

## 2013-09-22 LAB — COMPREHENSIVE METABOLIC PANEL
ALT: 12 U/L (ref 0–35)
AST: 24 U/L (ref 0–37)
Albumin: 3.4 g/dL — ABNORMAL LOW (ref 3.5–5.2)
Alkaline Phosphatase: 99 U/L (ref 39–117)
BUN: 11 mg/dL (ref 6–23)
CALCIUM: 9.1 mg/dL (ref 8.4–10.5)
CO2: 28 meq/L (ref 19–32)
Chloride: 98 mEq/L (ref 96–112)
Creatinine, Ser: 0.71 mg/dL (ref 0.50–1.10)
GFR, EST AFRICAN AMERICAN: 90 mL/min — AB (ref >90)
GFR, EST NON AFRICAN AMERICAN: 78 mL/min — AB (ref >90)
GLUCOSE: 99 mg/dL (ref 70–99)
Potassium: 3.8 mEq/L (ref 3.7–5.3)
Sodium: 138 mEq/L (ref 137–147)
TOTAL PROTEIN: 6.6 g/dL (ref 6.0–8.3)
Total Bilirubin: 0.3 mg/dL (ref 0.3–1.2)

## 2013-09-22 LAB — URINALYSIS, ROUTINE W REFLEX MICROSCOPIC
BILIRUBIN URINE: NEGATIVE
Glucose, UA: NEGATIVE mg/dL
HGB URINE DIPSTICK: NEGATIVE
Ketones, ur: 15 mg/dL — AB
Leukocytes, UA: NEGATIVE
NITRITE: POSITIVE — AB
PROTEIN: NEGATIVE mg/dL
SPECIFIC GRAVITY, URINE: 1.016 (ref 1.005–1.030)
UROBILINOGEN UA: 0.2 mg/dL (ref 0.0–1.0)
pH: 6 (ref 5.0–8.0)

## 2013-09-22 LAB — POCT URINALYSIS DIPSTICK
Glucose, UA: NEGATIVE
Leukocytes, UA: NEGATIVE
Nitrite, UA: 8
PH UA: 5
RBC UA: NEGATIVE
SPEC GRAV UA: 1.015
UROBILINOGEN UA: 0.2

## 2013-09-22 LAB — CK TOTAL AND CKMB (NOT AT ARMC)
CK TOTAL: 91 U/L (ref 7–177)
CK, MB: 1.2 ng/mL (ref 0.3–4.0)
Relative Index: INVALID (ref 0.0–2.5)

## 2013-09-22 LAB — PRO B NATRIURETIC PEPTIDE: Pro B Natriuretic peptide (BNP): 633.8 pg/mL — ABNORMAL HIGH (ref 0–450)

## 2013-09-22 MED ORDER — FLUTICASONE PROPIONATE 50 MCG/ACT NA SUSP
2.0000 | Freq: Every day | NASAL | Status: DC
  Administered 2013-09-22 – 2013-09-28 (×6): 2 via NASAL
  Filled 2013-09-22 (×2): qty 16

## 2013-09-22 MED ORDER — DONEPEZIL HCL 5 MG PO TABS
5.0000 mg | ORAL_TABLET | Freq: Two times a day (BID) | ORAL | Status: DC
  Administered 2013-09-22 – 2013-09-28 (×12): 5 mg via ORAL
  Filled 2013-09-22 (×14): qty 1

## 2013-09-22 MED ORDER — HYDRALAZINE HCL 20 MG/ML IJ SOLN
5.0000 mg | Freq: Four times a day (QID) | INTRAMUSCULAR | Status: DC | PRN
  Filled 2013-09-22: qty 0.25

## 2013-09-22 MED ORDER — SODIUM CHLORIDE 0.9 % IV BOLUS (SEPSIS)
500.0000 mL | Freq: Once | INTRAVENOUS | Status: AC
  Administered 2013-09-22: 500 mL via INTRAVENOUS

## 2013-09-22 MED ORDER — ACETAMINOPHEN 325 MG PO TABS
650.0000 mg | ORAL_TABLET | Freq: Four times a day (QID) | ORAL | Status: DC | PRN
  Administered 2013-09-22 – 2013-09-26 (×4): 650 mg via ORAL
  Filled 2013-09-22 (×4): qty 2

## 2013-09-22 MED ORDER — CALCIUM CARBONATE-VITAMIN D 500-200 MG-UNIT PO TABS
1.0000 | ORAL_TABLET | Freq: Every day | ORAL | Status: DC
  Administered 2013-09-22 – 2013-09-28 (×7): 1 via ORAL
  Filled 2013-09-22 (×8): qty 1

## 2013-09-22 MED ORDER — BUDESONIDE-FORMOTEROL FUMARATE 80-4.5 MCG/ACT IN AERO
2.0000 | INHALATION_SPRAY | Freq: Two times a day (BID) | RESPIRATORY_TRACT | Status: DC
  Administered 2013-09-22 – 2013-09-28 (×12): 2 via RESPIRATORY_TRACT
  Filled 2013-09-22: qty 6.9

## 2013-09-22 MED ORDER — DEXTROSE 5 % IV SOLN
1.0000 g | INTRAVENOUS | Status: DC
  Filled 2013-09-22: qty 10

## 2013-09-22 MED ORDER — DEXTROSE 5 % IV SOLN
500.0000 mg | INTRAVENOUS | Status: DC
  Administered 2013-09-23 – 2013-09-28 (×6): 500 mg via INTRAVENOUS
  Filled 2013-09-22 (×8): qty 500

## 2013-09-22 MED ORDER — ENOXAPARIN SODIUM 40 MG/0.4ML ~~LOC~~ SOLN
40.0000 mg | SUBCUTANEOUS | Status: DC
  Administered 2013-09-22 – 2013-09-27 (×6): 40 mg via SUBCUTANEOUS
  Filled 2013-09-22 (×7): qty 0.4

## 2013-09-22 MED ORDER — HYDROCOD POLST-CHLORPHEN POLST 10-8 MG/5ML PO LQCR
5.0000 mL | Freq: Two times a day (BID) | ORAL | Status: DC | PRN
Start: 2013-09-22 — End: 2013-09-28
  Administered 2013-09-22 – 2013-09-27 (×3): 5 mL via ORAL
  Filled 2013-09-22 (×3): qty 5

## 2013-09-22 MED ORDER — ONDANSETRON HCL 4 MG/2ML IJ SOLN: 4.0000 mg | Freq: Four times a day (QID) | INTRAMUSCULAR | Status: DC | PRN

## 2013-09-22 MED ORDER — SODIUM CHLORIDE 0.9 % IV SOLN
INTRAVENOUS | Status: DC
  Administered 2013-09-22 – 2013-09-23 (×2): via INTRAVENOUS

## 2013-09-22 NOTE — H&P (Addendum)
[facility] History and Physical  Donna Flowers KGU:[id number] DOB: [birth year] DOA: 09/22/2013  Referring physician: Dr.Paz, Direct Admission PCP: Kathlene November, MD   Chief Complaint: not acting right  HPI: Donna Flowers is a 80 y.o. female with PMH of Dementia, COPD, Osteoporosis, lives independently, she has short term memory loss at baseline, but otherwise not confused and able to carry a reasonable conversation. She went to the beach with her son and family and got back over the weekend. For the last 3 days she has been confused compared to baseline with decreased Po intake and not doing things that are routine for her.  She was also noted to have a cough, nonproductive for past few days. Due to worsening confusion and poor PO intake her son took her to PCP Dr.Paz's office and subsequently sent to [facility] as  a Direct Admission. No h/o N/V/Abd pain or diarrhea, no fevers at home but at office today No changes in medications or new medications No labs, xrays, done yet.   Review of Systems:  Constitutional:  No weight loss, night sweats, Fevers, chills, fatigue.  HEENT:  No headaches, Difficulty swallowing,Tooth/dental problems,Sore throat,  No sneezing, itching, ear ache, nasal congestion, post nasal drip,  Cardio-vascular:  No chest pain, Orthopnea, PND, swelling in lower extremities, anasarca, dizziness, palpitations  GI:  No heartburn, indigestion, abdominal pain, nausea, vomiting, diarrhea, change in bowel habits, loss of appetite  Resp:  No shortness of breath with exertion or at rest. No excess mucus, no productive cough, No non-productive cough, No coughing up of blood.No change in color of mucus.No wheezing.No chest wall deformity  Skin:  no rash or lesions.  GU:  no dysuria, change in color of urine, no urgency or frequency. No flank pain.  Musculoskeletal:  No joint pain or swelling. No decreased range of motion. No back pain.  Psych:  No change in mood  or affect. No depression or anxiety. No memory loss.   Past Medical History  Diagnosis Date  . COPD (chronic obstructive pulmonary disease)   . Osteoporosis   . Dementia    Past Surgical History  Procedure Laterality Date  . Joint replacement      right hip, left knee  . Ankle surgery      right   Social History:  reports that she has never smoked. She does not have any smokeless tobacco history on file. She reports that she does not drink alcohol or use illicit drugs.  No Known Allergies  No family history on file.   Prior to Admission medications   Medication Sig Start Date End Date Taking? Authorizing Provider  budesonide-formoterol (SYMBICORT) 80-4.5 MCG/ACT inhaler Inhale 2 puffs into the lungs 2 (two) times daily. 09/01/12   Colon Branch, MD  calcium-vitamin D (OSCAL WITH D) 500-200 MG-UNIT per tablet Take 1 tablet by mouth daily.    Historical Provider, MD  donepezil (ARICEPT) 5 MG tablet TAKE 1 TABLET BY MOUTH TWICE A DAY 05/08/13   Colon Branch, MD  fluticasone [facility]) 50 MCG/ACT nasal spray Place 2 sprays into both nostrils daily. 05/01/13   Midge Minium, MD  ibandronate (BONIVA) 150 MG tablet Take 1 tablet (150 mg total) by mouth every 30 (thirty) days. Take in the morning with a full glass of water, on an empty stomach, and do not take anything else by mouth or lie down for the next 30 min. 09/01/12   Colon Branch, MD  Multiple Vitamins-Minerals (MULTIVITAMIN WITH  MINERALS) tablet Take 1 tablet by mouth daily.    Historical Provider, MD   Physical Exam: Filed Vitals:   09/22/13 1737  Temp: 100.7 F (38.2 C)    Temp(Src) 100.7 F (38.2 C) (Oral)  SpO2 85%  General:  Appears calm and comfortable, confused, would not answer any quations, blan stares Eyes: PERRL, normal lids, irises & conjunctiva ENT: grossly normal hearing, lips & tongue Neck: no LAD, masses or thyromegaly, no meningeal signs Cardiovascular: RRR, no m/r/g. No LE edema. Respiratory: Normal  respiratory effort, diminished BS at R base Abdomen: soft, Nt, ND, BS present Skin: no rash or induration seen on limited exam Musculoskeletal: grossly normal tone BUE/BLE Psychiatric: unable to assess, flat affect Neurologic: grossly non-focal, encephalopathic, moves all extremities, cranial nerves grossly intact, DTR 1plus b/l, plantars withdrawal          Labs on Admission:  Basic Metabolic Panel: No results found for this basename: NA, K, CL, CO2, GLUCOSE, BUN, CREATININE, CALCIUM, MG, PHOS,  in the last 168 hours Liver Function Tests: No results found for this basename: AST, ALT, ALKPHOS, BILITOT, PROT, ALBUMIN,  in the last 168 hours No results found for this basename: LIPASE, AMYLASE,  in the last 168 hours No results found for this basename: AMMONIA,  in the last 168 hours CBC: No results found for this basename: WBC, NEUTROABS, HGB, HCT, MCV, PLT,  in the last 168 hours Cardiac Enzymes: No results found for this basename: CKTOTAL, CKMB, CKMBINDEX, TROPONINI,  in the last 168 hours  BNP (last 3 results) No results found for this basename: PROBNP,  in the last 8760 hours CBG: No results found for this basename: GLUCAP,  in the last 168 hours  Radiological Exams on Admission: No results found.   Assessment/Plan  1. Fever/Cough/Metabolic encephalopathy -symptoms concerning for early pneumonia -check CXR -start empiric Abx-ceftriaxone and zithromax -no meningeal signs -check CBC, Cmet, lactic acid -check urinalysis, urine Cx -urine legionella and pneumo Ag  2. COPD -stable, nebs PRN  3. Dementia -with worsening mentation due to #1 -continue aricept -at risk for increasing delirium/agitation  4. DVT proph: lovenox  Code Status: DNR Family Communication: d/w son at bedside Disposition Plan: inpatient  Time spent: 61min  Arhianna Ebey [facility] Pager [phone]

## 2013-09-22 NOTE — Progress Notes (Signed)
Subjective:    Patient ID: Donna Flowers, female    DOB: June 08, 1928, 80 y.o.   MRN: [id number]  DOS:  09/22/2013 Type of  visit: Acute visit,son concerned about progressive dementia The patient came back from a short trip to the beach 2 weeks ago and since then she is more confused than usual. This afternoon, John --her son-- went to check on her, she was still on pajamas, she didn't interact well, was very quiet and withdrawn. At that time, her motor was symmetric and she did not have slurred speech. he did notice some shakiness. When further questioned, in the last few days she has been coughing .  ROS Answers are limited by patient's dementia but apparently there has been no fever, chills. No nausea, vomiting, diarrhea abdominal pain. No dysuria, hematuria or difficulty urinating. Headaches? No dizziness. No head injury. The family has not observed any stroke like symptoms such as facial weakness or slurred speech.   Past Medical History:  DJD, sees ortho  COPD  Osteoporosis used to see endocrinology  Urinary incontinence  Decreased memory, aricept started ~ 2011  Skin ca-- [facility]  Past Surgical History:  Right hip replacement x 2  Right ankkle surgeries  Right wrist  left elbow  Hysterectomy: secondary fibroid. 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  Widow, 2 sons , one in [place] and another in [place]-- son Sruthi Maurer, phone [phone]  Never Smoked  Alcohol use- no further since ~ 02-2012  Lives by herself, still drives only short distances   no fever or chills     Past Medical History  Diagnosis Date  . COPD (chronic obstructive pulmonary disease)   . Osteoporosis   . Dementia              Medication List       This list is accurate as of: 09/22/13  3:45 PM.  Always use your most recent med list.               benzonatate 200 MG capsule  Commonly known as:  TESSALON  Take 1 capsule (200 mg total) by mouth 3 (three) times daily as needed for cough.     budesonide-formoterol 80-4.5 MCG/ACT inhaler  Commonly known as:  SYMBICORT  Inhale 2 puffs into the lungs 2 (two) times daily.     calcium-vitamin D 500-200 MG-UNIT per tablet  Commonly known as:  OSCAL WITH D  Take 1 tablet by mouth daily.     donepezil 5 MG tablet  Commonly known as:  ARICEPT  TAKE 1 TABLET BY MOUTH TWICE A DAY     fluticasone 50 MCG/ACT nasal spray  Commonly known as:  FLONASE  Place 2 sprays into both nostrils daily.     ibandronate 150 MG tablet  Commonly known as:  BONIVA  Take 1 tablet (150 mg total) by mouth every 30 (thirty) days. Take in the morning with a full glass of water, on an empty stomach, and do not take anything else by mouth or lie down for the next 30 min.     multivitamin with minerals tablet  Take 1 tablet by mouth daily.     zoster vaccine live (PF) 19400 UNT/0.65ML injection  Commonly known as:  ZOSTAVAX  Inject 19,400 Units into the skin once.           Objective:   Physical Exam BP 172/74  Pulse 85  Temp(Src) 99.9 F (37.7 C)  Wt 123 lb (55.792 kg)  SpO2 94% General -- alert, demented, temperature 99.9,+ chills.  HEENT-- Not pale. Face symmetric, sinuses not tender to palpation. Nose congested. Lungs -- no distress, large airway congestion with cough, but sounds decreased at the bases, more noticeable on the right Heart-- normal rate, regular rhythm, no murmur.  Abdomen-- Not distended, good bowel sounds,soft, non-tender. Extremities-- no pretibial edema bilaterally  Neurologic--  Alert, Note oriented, did not recognize her son ( thought he was her other son) Psych--   No anxious or depressed appearing.      Assessment & Plan:  80 year old lady  presents with acute worsening of her dementia, low-grade temperature, cough, chills. Is likely  she has pneumonia. Udip ketones, UCX sent  I don't think she can tolerate to be elevated  in the outpatient setting. After discussed the case with her son who agreed to admit her to the hospital with fever, likely pneumonia, acute deterioration of dementia (see last visit was oriented x 3). Needs further workup and  evaluation Addendum : spoke with Dr. Charlies Silvers Patient to be admitted to team 8 at [facility], regular meds.

## 2013-09-22 NOTE — Patient Instructions (Signed)
You will be admitted to [facility] Team 8 Authorized by Dr Rogers Blocker

## 2013-09-22 NOTE — Progress Notes (Signed)
Pre visit review using our clinic review tool, if applicable. No additional management support is needed unless otherwise documented below in the visit note. 

## 2013-09-22 NOTE — Progress Notes (Signed)
Pt with elevated temp of 101.4 axillary. NP made aware. Madelin Headings

## 2013-09-23 DIAGNOSIS — N390 Urinary tract infection, site not specified: Secondary | ICD-10-CM

## 2013-09-23 LAB — CBC
HCT: 36.9 % (ref 36.0–46.0)
Hemoglobin: 12.2 g/dL (ref 12.0–15.0)
MCH: 28.3 pg (ref 26.0–34.0)
MCHC: 33.1 g/dL (ref 30.0–36.0)
MCV: 85.6 fL (ref 78.0–100.0)
PLATELETS: 129 10*3/uL — AB (ref 150–400)
RBC: 4.31 MIL/uL (ref 3.87–5.11)
RDW: 14.3 % (ref 11.5–15.5)
WBC: 11 10*3/uL — AB (ref 4.0–10.5)

## 2013-09-23 LAB — LEGIONELLA ANTIGEN, URINE: Legionella Antigen, Urine: NEGATIVE

## 2013-09-23 LAB — BASIC METABOLIC PANEL
BUN: 10 mg/dL (ref 6–23)
CHLORIDE: 103 meq/L (ref 96–112)
CO2: 27 mEq/L (ref 19–32)
CREATININE: 0.76 mg/dL (ref 0.50–1.10)
Calcium: 8.1 mg/dL — ABNORMAL LOW (ref 8.4–10.5)
GFR, EST AFRICAN AMERICAN: 88 mL/min — AB (ref >90)
GFR, EST NON AFRICAN AMERICAN: 76 mL/min — AB (ref >90)
Glucose, Bld: 99 mg/dL (ref 70–99)
Potassium: 3.3 mEq/L — ABNORMAL LOW (ref 3.7–5.3)
Sodium: 141 mEq/L (ref 137–147)

## 2013-09-23 LAB — RESPIRATORY VIRUS PANEL
Adenovirus: NOT DETECTED
Influenza A H1: NOT DETECTED
Influenza A H3: NOT DETECTED
Influenza A: NOT DETECTED
Influenza B: NOT DETECTED
Metapneumovirus: DETECTED — AB
Parainfluenza 1: NOT DETECTED
Parainfluenza 2: NOT DETECTED
Parainfluenza 3: NOT DETECTED
Respiratory Syncytial Virus A: NOT DETECTED
Respiratory Syncytial Virus B: NOT DETECTED
Rhinovirus: NOT DETECTED

## 2013-09-23 LAB — INFLUENZA PANEL BY PCR (TYPE A & B)
H1N1 flu by pcr: NOT DETECTED
INFLBPCR: NEGATIVE
Influenza A By PCR: NEGATIVE

## 2013-09-23 MED ORDER — DEXTROSE 5 % IV SOLN
1.0000 g | INTRAVENOUS | Status: DC
  Administered 2013-09-23 – 2013-09-28 (×6): 1 g via INTRAVENOUS
  Filled 2013-09-23 (×6): qty 10

## 2013-09-23 MED ORDER — POTASSIUM CHLORIDE IN NACL 20-0.9 MEQ/L-% IV SOLN
INTRAVENOUS | Status: DC
  Administered 2013-09-23 (×2): via INTRAVENOUS
  Filled 2013-09-23 (×3): qty 1000

## 2013-09-23 NOTE — Progress Notes (Signed)
[facility] PROGRESS NOTE  Donna Flowers CHE:[id number] DOB: 02-08-1928 DOA: 09/22/2013 PCP: Kathlene November, MD  Assessment/Plan: 80 y.o. female with PMH of Dementia, COPD, Osteoporosis, lives independently, she has short term memory loss at baseline  1. Fever/Cough ? Developing pneumonia; Initial CXR: unremarkable -cont empiric atx, bronchodilators, pend cultures, influenza, viral panel; will repeat CXR if no improvement   2. Metabolic encephalopathy symptoms concerning for early pneumonia  -neuro exam no focal; mild confusion at baseline; cont monitoring   3. COPD stable, nebs PRN   4. Dementia  with worsening mentation due to #1  -continue aricept at risk for increasing delirium/agitation; monitor   5. Probable UTI, started atx; pend cultures    Code Status: DNR Family Communication: D/W PATIENT, called updated Donna Flowers, Donna Flowers [phone]  (indicate person spoken with, relationship, and if by phone, the number) Disposition Plan: pend clinical improvemen t   Consultants:  None   Procedures:  noen   Antibiotics:  ceftriaxone  Azithromycin   (indicate start date, and stop date if known)  HPI/Subjective: Alert, but confused  Objective: Filed Vitals:   09/23/13 0600  BP: 122/48  Pulse: 66  Temp: 98.3 F (36.8 C)  Resp: 18    Intake/Output Summary (Last 24 hours) at 09/23/13 0827 Last data filed at 09/23/13 0525  Gross per 24 hour  Intake 991.45 ml  Output    200 ml  Net 791.45 ml   There were no vitals filed for this visit.  Exam:   General:  alert  Cardiovascular: s1,s2 rrr  Respiratory: BL rales   Abdomen: soft, nt,nd   Musculoskeletal: no LE edema   Data Reviewed: Basic Metabolic Panel:  Recent Labs Lab 09/22/13 1840 09/23/13 0419  NA 138 141  K 3.8 3.3*  CL 98 103  CO2 28 27  GLUCOSE 99 99  BUN 11 10  CREATININE 0.71 0.76  CALCIUM 9.1 8.1*   Liver Function Tests:  Recent Labs Lab 09/22/13 1840  AST 24   ALT 12  ALKPHOS 99  BILITOT 0.3  PROT 6.6  ALBUMIN 3.4*   No results found for this basename: LIPASE, AMYLASE,  in the last 168 hours No results found for this basename: AMMONIA,  in the last 168 hours CBC:  Recent Labs Lab 09/22/13 1840 09/23/13 0419  WBC 9.6 11.0*  NEUTROABS 8.2*  --   HGB 13.9 12.2  HCT 40.4 36.9  MCV 85.6 85.6  PLT 145* 129*   Cardiac Enzymes:  Recent Labs Lab 09/22/13 1840  CKTOTAL 91  CKMB 1.2   BNP (last 3 results)  Recent Labs  09/22/13 1840  PROBNP 633.8*   CBG: No results found for this basename: GLUCAP,  in the last 168 hours  No results found for this or any previous visit (from the past 240 hour(s)).   Studies: Dg Chest 2 View  09/22/2013   COUGH, THIN CLINICAL DATA: Shin  EXAM: CHEST  2 VIEW  COMPARISON:  DG CHEST 2 VIEW dated 10/04/2011; DG CHEST 2 VIEW dated 04/24/2004; DG CHEST 2 VIEW dated 10/06/2002  FINDINGS: The lungs are hyperinflated likely secondary to COPD. There are bilateral chronic bronchitic changes. There is no focal parenchymal opacity, pleural effusion, or pneumothorax. The heart and mediastinal contours are unremarkable.  The osseous structures are unremarkable.  IMPRESSION: No active cardiopulmonary disease.   Electronically Signed   By: Kathreen Devoid   On: 09/22/2013 22:20    Scheduled Meds: . azithromycin  500 mg Intravenous Q24H  .  budesonide-formoterol  2 puff Inhalation BID  . calcium-vitamin D  1 tablet Oral Daily  . cefTRIAXone (ROCEPHIN)  IV  1 g Intravenous Q24H  . donepezil  5 mg Oral BID  . enoxaparin (LOVENOX) injection  40 mg Subcutaneous Q24H  . fluticasone  2 spray Each Nare Daily   Continuous Infusions: . sodium chloride 75 mL/hr at 09/23/13 1856    Principal Problem:   Encephalopathy, metabolic Active Problems:   COPD   OSTEOPOROSIS   Dementia   Fever   Cough   Toxic metabolic encephalopathy    Time spent: >35 minutes     Kinnie Feil  [facility] Pager [phone].  If 7PM-7AM, please contact night-coverage at [email or web address], password [facility] 09/23/2013, 8:27 AM  LOS: 1 day

## 2013-09-23 NOTE — Evaluation (Signed)
Physical Therapy Evaluation Patient Details Name: Donna Flowers MRN: [id number] DOB: 1928-05-07 Today's Date: 09/23/2013   History of Present Illness  Pt is an 80 y.o. female with PMHx of Dementia, COPD, and Osteoporosis; per chart review pt "lives independently, has short term memory loss at baseline, but otherwise not confused and able to carry a reasonable conversation".  Clinical Impression  Patient evaluated by Physical Therapy with no further acute PT needs identified. All education has been completed and the patient has no further questions. Pt not orientated however appropriate during therapy.  Pt with questionable cognition as she has hx of dementia, no family present for baseline.  Pt ambulated in hallway with RW and no unsteadiness or LOB observed while remaining on 3L O2.  Recommend at least initial supervision upon d/c for mobility especially if pt needs to return home on oxygen, however pt likely close to her baseline in regards to mobility.  Recommend nsg staff ambulate with pt if pt remains in acute care. See below for any follow-up Physial Therapy or equipment needs. PT is signing off. Thank you for this referral.     Follow Up Recommendations No PT follow up;Supervision for mobility/OOB    Equipment Recommendations  Rolling walker with 5" wheels (if does not own)    Recommendations for Other Services       Precautions / Restrictions        Mobility  Bed Mobility Overal bed mobility: Needs Assistance Bed Mobility: Supine to Sit;Sit to Supine     Supine to sit: Supervision Sit to supine: Supervision   General bed mobility comments: supervision for lines  Transfers Overall transfer level: Needs assistance Equipment used: Rolling walker (2 wheeled) Transfers: Sit to/from Stand Sit to Stand: Min guard         General transfer comment: min/guard for safety however no assist required  Ambulation/Gait Ambulation/Gait assistance: Min guard Ambulation  Distance (Feet): 240 Feet Assistive device: Rolling walker (2 wheeled) Gait Pattern/deviations: Step-through pattern Gait velocity: decr   General Gait Details: no unsteadiness observed, used RW (pt reports using cane at home), maintained 3L O2 (no dynamaps available to assess O2)  Stairs            Wheelchair Mobility    Modified Rankin (Stroke Patients Only)       Balance                                             Pertinent Vitals/Pain No pain reported, remained on 3L O2 [place]    Home Living Family/patient expects to be discharged to:: Private residence Living Arrangements: Spouse/significant other   Type of Home: House Home Access: Stairs to enter     Home Layout: Able to live on main level with bedroom/bathroom Home Equipment: Cane - single point Additional Comments: pt may be poor historian, ?accuracy of this home information    Prior Function Level of Independence: Independent with assistive device(s)               Hand Dominance        Extremity/Trunk Assessment               Lower Extremity Assessment: Overall WFL for tasks assessed         Communication   Communication: No difficulties  Cognition Arousal/Alertness: Awake/alert Behavior During Therapy: WFL for tasks assessed/performed Overall Cognitive Status:  No family/caregiver present to determine baseline cognitive functioning Area of Impairment: Orientation Orientation Level: Place;Time             General Comments: pt not orientated but appropriate    General Comments      Exercises        Assessment/Plan    PT Assessment Patent does not need any further PT services  PT Diagnosis     PT Problem List    PT Treatment Interventions     PT Goals (Current goals can be found in the Care Plan section) Acute Rehab PT Goals PT Goal Formulation: No goals set, d/c therapy    Frequency     Barriers to discharge        Co-evaluation                End of Session Equipment Utilized During Treatment: Gait belt;Oxygen Activity Tolerance: Patient tolerated treatment well Patient left: in bed;with call bell/phone within reach;with bed alarm set           Time: 1413-1431 PT Time Calculation (min): 18 min   Charges:   PT Evaluation $Initial PT Evaluation Tier I: 1 Procedure PT Treatments $Gait Training: 8-22 mins   PT G CodesJunius Argyle 09/23/2013, 3:28 PM Carmelia Bake, PT, DPT 09/23/2013 Pager: [phone]

## 2013-09-24 ENCOUNTER — Inpatient Hospital Stay (HOSPITAL_COMMUNITY): Payer: Medicare Other

## 2013-09-24 LAB — BASIC METABOLIC PANEL
BUN: 10 mg/dL (ref 6–23)
CO2: 25 mEq/L (ref 19–32)
CREATININE: 0.68 mg/dL (ref 0.50–1.10)
Calcium: 8.1 mg/dL — ABNORMAL LOW (ref 8.4–10.5)
Chloride: 105 mEq/L (ref 96–112)
GFR calc non Af Amer: 79 mL/min — ABNORMAL LOW (ref >90)
GLUCOSE: 80 mg/dL (ref 70–99)
Potassium: 3.5 mEq/L — ABNORMAL LOW (ref 3.7–5.3)
Sodium: 140 mEq/L (ref 137–147)

## 2013-09-24 LAB — CBC
HCT: 35.9 % — ABNORMAL LOW (ref 36.0–46.0)
Hemoglobin: 12.1 g/dL (ref 12.0–15.0)
MCH: 28.8 pg (ref 26.0–34.0)
MCHC: 33.7 g/dL (ref 30.0–36.0)
MCV: 85.5 fL (ref 78.0–100.0)
Platelets: 110 10*3/uL — ABNORMAL LOW (ref 150–400)
RBC: 4.2 MIL/uL (ref 3.87–5.11)
RDW: 14.5 % (ref 11.5–15.5)
WBC: 9.8 10*3/uL (ref 4.0–10.5)

## 2013-09-24 MED ORDER — HALOPERIDOL 2 MG PO TABS
2.0000 mg | ORAL_TABLET | Freq: Once | ORAL | Status: AC
  Administered 2013-09-24: 2 mg via ORAL
  Filled 2013-09-24: qty 1

## 2013-09-24 MED ORDER — HALOPERIDOL 0.5 MG PO TABS
0.5000 mg | ORAL_TABLET | Freq: Three times a day (TID) | ORAL | Status: DC | PRN
  Administered 2013-09-24: 0.5 mg via ORAL
  Filled 2013-09-24: qty 1

## 2013-09-24 NOTE — Progress Notes (Signed)
Agree with the previous RN's assessment. Will continue to monitor patient. Aadin Gaut M Setzer  

## 2013-09-24 NOTE — Progress Notes (Signed)
[facility] PROGRESS NOTE  Donna Flowers [id number] DOB: 1927/09/10 DOA: 09/22/2013 PCP: Kathlene November, MD  Assessment/Plan: 80 y.o. female with PMH of Dementia, COPD, Osteoporosis, lives independently, she has short term memory loss at baseline  1. Fever/Cough ? Developing pneumonia; Initial CXR: unremarkable; influenza neg, but +Metapneumovirus -cont empiric atx, bronchodilators, repeat CXR;   2. Metabolic encephalopathy symptoms concerning for early pneumonia  -neuro exam no focal; mild confusion at baseline; cont monitoring   3. COPD stable, nebs PRN   4. Dementia  with worsening mentation due to #1  -continue aricept at risk for increasing delirium/agitation; monitor   5. Probable UTI, cont atx; p+ E coli, pend sens     Code Status: DNR Family Communication: D/W PATIENT, called updated Shoni, [place] [phone]  (indicate person spoken with, relationship, and if by phone, the number) Disposition Plan: pend clinical improvemen t 24-48 hours    Consultants:  None   Procedures:  noen   Antibiotics:  ceftriaxone  Azithromycin   (indicate start date, and stop date if known)  HPI/Subjective: Alert, but confused  Objective: Filed Vitals:   09/24/13 0559  BP: 135/66  Pulse: 75  Temp: 97.9 F (36.6 C)  Resp: 18    Intake/Output Summary (Last 24 hours) at 09/24/13 0855 Last data filed at 09/23/13 1452  Gross per 24 hour  Intake    600 ml  Output      0 ml  Net    600 ml   There were no vitals filed for this visit.  Exam:   General:  alert  Cardiovascular: s1,s2 rrr  Respiratory: BL rales   Abdomen: soft, nt,nd   Musculoskeletal: no LE edema   Data Reviewed: Basic Metabolic Panel:  Recent Labs Lab 09/22/13 1840 09/23/13 0419 09/24/13 0403  NA 138 141 140  K 3.8 3.3* 3.5*  CL 98 103 105  CO2 _0 GLUCOSE 99 99 80  BUN _1 CREATININE 0.71 0.76 0.68  CALCIUM 9.1 8.1* 8.1*   Liver Function  Tests:  Recent Labs Lab 09/22/13 1840  AST 24  ALT 12  ALKPHOS 99  BILITOT 0.3  PROT 6.6  ALBUMIN 3.4*   No results found for this basename: LIPASE, AMYLASE,  in the last 168 hours No results found for this basename: AMMONIA,  in the last 168 hours CBC:  Recent Labs Lab 09/22/13 1840 09/23/13 0419 09/24/13 0403  WBC 9.6 11.0* 9.8  NEUTROABS 8.2*  --   --   HGB 13.9 12.2 12.1  HCT 40.4 36.9 35.9*  MCV 85.6 85.6 85.5  PLT 145* 129* 110*   Cardiac Enzymes:  Recent Labs Lab 09/22/13 1840  CKTOTAL 91  CKMB 1.2   BNP (last 3 results)  Recent Labs  09/22/13 1840  PROBNP 633.8*   CBG: No results found for this basename: GLUCAP,  in the last 168 hours  Recent Results (from the past 240 hour(s))  URINE CULTURE     Status: None   Collection Time    09/22/13  4:44 PM      Result Value Ref Range Status   Colony Count >=100,000 COLONIES/ML   Preliminary   Preliminary Report ESCHERICHIA COLI   Preliminary  URINE CULTURE     Status: None   Collection Time    09/22/13  7:57 PM      Result Value Ref Range Status   Specimen Description URINE, CLEAN CATCH   Final   Special Requests  NONE   Final   Culture  Setup Time     Final   Value: 09/23/2013 01:19     Performed at [place]     Final   Value: >=100,000 COLONIES/ML     Performed at [facility]   Culture     Final   Value: ESCHERICHIA COLI     Performed at [facility]   Report Status PENDING   Incomplete  RESPIRATORY VIRUS PANEL     Status: Abnormal   Collection Time    09/22/13 11:01 PM      Result Value Ref Range Status   Source - RVPAN NASOPHARYNGEAL   Final   Respiratory Syncytial Virus A NOT DETECTED   Final   Respiratory Syncytial Virus B NOT DETECTED   Final   Influenza A NOT DETECTED   Final   Influenza B NOT DETECTED   Final   Parainfluenza 1 NOT DETECTED   Final   Parainfluenza 2 NOT DETECTED   Final   Parainfluenza 3 NOT DETECTED   Final    Metapneumovirus DETECTED (*)  Final   Rhinovirus NOT DETECTED   Final   Adenovirus NOT DETECTED   Final   Influenza A H1 NOT DETECTED   Final   Influenza A H3 NOT DETECTED   Final   Comment: (NOTE)           Normal Reference Range for each Analyte: NOT DETECTED     Testing performed using the Luminex xTAG Respiratory Viral Panel test     kit.     This test was developed and its performance characteristics determined     by [facility]. It has not been cleared or approved by the [place]     Food and Drug Administration. This test is used for clinical purposes.     It should not be regarded as investigational or for research. This     laboratory is certified under the [place] (CLIA) as qualified to perform high complexity     clinical laboratory testing.     Performed at [place], BLOOD (ROUTINE X 2)     Status: None   Collection Time    09/23/13 12:08 AM      Result Value Ref Range Status   Specimen Description BLOOD LEFT HAND   Final   Special Requests BOTTLES DRAWN AEROBIC ONLY [facility]   Final   Culture  Setup Time     Final   Value: 09/23/2013 04:57     Performed at [facility]   Culture     Final   Value:        BLOOD CULTURE RECEIVED NO GROWTH TO DATE CULTURE WILL BE HELD FOR 5 DAYS BEFORE ISSUING A FINAL NEGATIVE REPORT     Performed at [facility]   Report Status PENDING   Incomplete  CULTURE, BLOOD (ROUTINE X 2)     Status: None   Collection Time    09/23/13 12:12 AM      Result Value Ref Range Status   Specimen Description BLOOD LEFT ANTECUBITAL   Final   Special Requests BOTTLES DRAWN AEROBIC AND ANAEROBIC [facility]   Final   Culture  Setup Time     Final   Value: 09/23/2013 04:56     Performed at [facility]   Culture     Final  Value:        BLOOD CULTURE RECEIVED NO GROWTH TO DATE CULTURE WILL BE HELD FOR 5 DAYS BEFORE ISSUING A FINAL NEGATIVE REPORT     Performed at [facility]   Report Status PENDING   Incomplete     Studies: Dg Chest 2 View  09/22/2013   COUGH, THIN CLINICAL DATA: Shin  EXAM: CHEST  2 VIEW  COMPARISON:  DG CHEST 2 VIEW dated 10/04/2011; DG CHEST 2 VIEW dated 04/24/2004; DG CHEST 2 VIEW dated 10/06/2002  FINDINGS: The lungs are hyperinflated likely secondary to COPD. There are bilateral chronic bronchitic changes. There is no focal parenchymal opacity, pleural effusion, or pneumothorax. The heart and mediastinal contours are unremarkable.  The osseous structures are unremarkable.  IMPRESSION: No active cardiopulmonary disease.   Electronically Signed   By: Kathreen Devoid   On: 09/22/2013 22:20    Scheduled Meds: . azithromycin  500 mg Intravenous Q24H  . budesonide-formoterol  2 puff Inhalation BID  . calcium-vitamin D  1 tablet Oral Daily  . cefTRIAXone (ROCEPHIN)  IV  1 g Intravenous Q24H  . donepezil  5 mg Oral BID  . enoxaparin (LOVENOX) injection  40 mg Subcutaneous Q24H  . fluticasone  2 spray Each Nare Daily   Continuous Infusions: . 0.9 % NaCl with KCl 20 mEq / L 75 mL/hr at 09/23/13 2140    Principal Problem:   Encephalopathy, metabolic Active Problems:   COPD   OSTEOPOROSIS   Dementia   Fever   Cough   Toxic metabolic encephalopathy    Time spent: >35 minutes     Kinnie Feil  [facility] Pager [phone]. If 7PM-7AM, please contact night-coverage at [email or web address], password [facility] 09/24/2013, 8:55 AM  LOS: 2 days

## 2013-09-24 NOTE — Evaluation (Signed)
Occupational Therapy Evaluation Patient Details Name: Donna Flowers MRN: [id number] DOB: 1928/04/19 Today's Date: 09/24/2013    History of Present Illness Pt is an 80 year old female with a h/o dementia, COPD and osteoporosis.  Per chart, she lives alone.  She was brought in by family after increased confusion   Clinical Impression   Pt was admitted for metabolic encephalopathy.  She has a h/o dementia and no family was in room, but per chart, she lives alone.  Pt was overall min A for ambulating to bathroom and transferring to commode. She needed cueing for thoroughness with self care.  Pt disoriented to place and situation during OT eval.  She will benefit from skilled OT with supervision level goals in acute.  Recommend 24/7 supervision at home for safety    Follow Up Recommendations  Supervision/Assistance - 24 hour (PT did not recommend follow up therapy so OT cannot recommend [facility])    Equipment Recommendations  3 in 1 bedside comode (may have)    Recommendations for Other Services       Precautions / Restrictions Precautions Precautions: Fall Restrictions Weight Bearing Restrictions: No      Mobility Bed Mobility         Supine to sit: Supervision Sit to supine: Supervision   General bed mobility comments: pt used arms to assist legs on and off bed  Transfers     Transfers: Sit to/from Stand Sit to Stand: Min guard         General transfer comment: min guard for safety    Balance                                            ADL Overall ADL's : Needs assistance/impaired     Grooming: Supervision/safety;Wash/dry hands;Wash/dry Sports administrator: Minimal assistance;Ambulation;Comfort height toilet;Grab bars   Toileting- Clothing Manipulation and Hygiene: Minimal assistance;Sit to/from stand       Functional mobility during ADLs: Minimal assistance General ADL Comments: Pt performed adl standing at sink  for UB and sit to stand in chair by sink for LB.  Needed cues to complete tasks getting all body areas.  After she was back in bed, she needed to use bathroom. Ambulated with min A and min A given for hygiene for thoroughness.  Pt insisting that she was here by mistake and needed assistance to get to her car.  Reoriented throughout session     Vision                     Perception     Praxis      Pertinent Vitals/Pain No c/o pain.  Pt on 3 liters 02. Removed to use bathroom and sats remained 94-99%.  Replaced 02 when pt back in bed.  Pt did have 2/4 dyspnea; encouraged rest breaks     Hand Dominance     Extremity/Trunk Assessment Upper Extremity Assessment Upper Extremity Assessment: Generalized weakness           Communication Communication Communication: No difficulties   Cognition Arousal/Alertness: Awake/alert Behavior During Therapy: WFL for tasks assessed/performed Overall Cognitive Status: No family/caregiver present to determine baseline cognitive functioning   Orientation Level: Place;Time             General  Comments: pt confused--insisting she is here by mistake--asking me to help her get down to her car   General Comments       Exercises       Shoulder Instructions      Home Living Family/patient expects to be discharged to:: Private residence                                 Additional Comments: unable to verify.  Per chart, lives alone      Prior Functioning/Environment Level of Independence: Independent with assistive device(s)             OT Diagnosis: Generalized weakness   OT Problem List: Decreased strength;Decreased activity tolerance;Impaired balance (sitting and/or standing);Decreased knowledge of use of DME or AE;Cardiopulmonary status limiting activity;Decreased cognition   OT Treatment/Interventions: Self-care/ADL training;DME and/or AE instruction;Patient/family education;Balance training;Energy  conservation    OT Goals(Current goals can be found in the care plan section) Acute Rehab OT Goals OT Goal Formulation: With patient Time For Goal Achievement: 10/08/13 Potential to Achieve Goals: Fair ADL Goals Pt Will Transfer to Toilet: with supervision;ambulating;bedside commode Additional ADL Goal #1: pt will set herself up with supervision and complete adl without cues  OT Frequency: Min 2X/week   Barriers to D/C:            Co-evaluation              End of Session    Activity Tolerance: Patient limited by fatigue Patient left: in bed;with call bell/phone within reach;with bed alarm set   Time: 1497-0263 OT Time Calculation (min): 36 min Charges:  OT General Charges $OT Visit: 1 Procedure OT Evaluation $Initial OT Evaluation Tier I: 1 Procedure OT Treatments $Self Care/Home Management : 23-37 mins G-Codes:    Lesle Chris 10/13/13, 12:48 PM Lesle Chris, OTR/L [phone] 10-13-2013

## 2013-09-25 DIAGNOSIS — J189 Pneumonia, unspecified organism: Secondary | ICD-10-CM

## 2013-09-25 LAB — URINE CULTURE
Colony Count: >=100000
Colony Count: >=100000

## 2013-09-25 NOTE — Progress Notes (Signed)
Occupational Therapy Treatment Patient Details Name: Donna Flowers MRN: [id number] DOB: [birth year] Today's Date: 09/25/2013    History of present illness Pt is an 80 year old female with a h/o dementia, COPD and osteoporosis.  Per chart, she lives alone.  She was brought in by family after increased confusion   OT comments  Pt making slow gains with OT.  Pt unsteady and furniture walks but no LOB.  Decreased STM  Follow Up Recommendations  Supervision/Assistance - 24 hour (SNF if pt doesn't have assist)    Equipment Recommendations  3 in 1 bedside comode    Recommendations for Other Services      Precautions / Restrictions Precautions Precautions: Fall       Mobility Bed Mobility         Supine to sit: Supervision Sit to supine: Supervision   General bed mobility comments: HOB raised.  Lifted legs without assistance of arms  Transfers     Transfers: Sit to/from Stand Sit to Stand: Min guard         General transfer comment: min guard for safety.  Used grab bar to get off commode    Balance                                   ADL                           Toilet Transfer: Minimal assistance;Ambulation;Comfort height toilet;Grab bars   Toileting- Clothing Manipulation and Hygiene: Min guard         General ADL Comments: Ambulated to bathroom with min A.  Pt tends to furniture walk.  She has a walker at home but doesn't use this. Pt was incontinent x urine.  Reoriented to call light      Vision                     Perception     Praxis      Cognition   Behavior During Therapy: [facility] for tasks assessed/performed Overall Cognitive Status: No family/caregiver present to determine baseline cognitive functioning   Orientation Level: Place;Time              General Comments: pt with decreased STM--unable to retain place or LUE edema was caused by IV    Extremity/Trunk Assessment               Exercises      Shoulder Instructions       General Comments      Pertinent Vitals/ Pain       No c/o pain  Home Living                                          Prior Functioning/Environment              Frequency Min 2X/week     Progress Toward Goals  OT Goals(current goals can now be found in the care plan section)  Progress towards OT goals: Progressing toward goals     Plan      Co-evaluation                 End of Session     Activity Tolerance Patient tolerated treatment well   Patient  Left in bed;with call bell/phone within reach;with bed alarm set   Nurse Communication          Time: [phone] OT Time Calculation (min): 15 min  Charges: OT General Charges $OT Visit: 1 Procedure OT Treatments $Self Care/Home Management : 8-22 mins  [facility] 09/25/2013, 12:37 PM  Lesle Chris, OTR/L [phone] 09/25/2013

## 2013-09-25 NOTE — Progress Notes (Signed)
[facility] PROGRESS NOTE  SHAKELIA SCRIVNER JAS:[id number] DOB: 1928-02-11 DOA: 09/22/2013 PCP: Kathlene November, MD  Assessment/Plan: 80 y.o. female with PMH of Dementia, COPD, Osteoporosis, lives independently, she has short term memory loss at baseline  1. BL pneumonia; Initial CXR: unremarkable; influenza neg, but +Metapneumovirus -repeat CXR showed BL pneumonia; cont empiric atx, bronchodilators; blood c/s NGTD  2. Metabolic encephalopathy symptoms concerning for early pneumonia  -neuro exam no focal; mild confusion at baseline; cont monitoring   3. COPD stable, nebs PRN   4. Dementia  with worsening mentation due to #1  -continue aricept at risk for increasing delirium/agitation; monitor   5. UTI, cont atx; p+ E coli    Code Status: DNR Family Communication: D/W PATIENT, called updated [place], [place] [phone]  (indicate person spoken with, relationship, and if by phone, the number) Disposition Plan: pend clinical improvemen t 24-48 hours    Consultants:  None   Procedures:  noen   Antibiotics:  ceftriaxone  Azithromycin   (indicate start date, and stop date if known)  HPI/Subjective: Alert, but confused  Objective: Filed Vitals:   09/25/13 0620  BP: 127/44  Pulse: 62  Temp: 97.3 F (36.3 C)  Resp: 20    Intake/Output Summary (Last 24 hours) at 09/25/13 1038 Last data filed at 09/24/13 1300  Gross per 24 hour  Intake    240 ml  Output      0 ml  Net    240 ml   Filed Weights   09/24/13 0853  Weight: 62.5 kg (137 lb 12.6 oz)    Exam:   General:  alert  Cardiovascular: s1,s2 rrr  Respiratory: BL rales   Abdomen: soft, nt,nd   Musculoskeletal: no LE edema   Data Reviewed: Basic Metabolic Panel:  Recent Labs Lab 09/22/13 1840 09/23/13 0419 09/24/13 0403  NA 138 141 140  K 3.8 3.3* 3.5*  CL 98 103 105  CO2 _0 GLUCOSE 99 99 80  BUN _1 CREATININE 0.71 0.76 0.68  CALCIUM 9.1 8.1* 8.1*   Liver  Function Tests:  Recent Labs Lab 09/22/13 1840  AST 24  ALT 12  ALKPHOS 99  BILITOT 0.3  PROT 6.6  ALBUMIN 3.4*   No results found for this basename: LIPASE, AMYLASE,  in the last 168 hours No results found for this basename: AMMONIA,  in the last 168 hours CBC:  Recent Labs Lab 09/22/13 1840 09/23/13 0419 09/24/13 0403  WBC 9.6 11.0* 9.8  NEUTROABS 8.2*  --   --   HGB 13.9 12.2 12.1  HCT 40.4 36.9 35.9*  MCV 85.6 85.6 85.5  PLT 145* 129* 110*   Cardiac Enzymes:  Recent Labs Lab 09/22/13 1840  CKTOTAL 91  CKMB 1.2   BNP (last 3 results)  Recent Labs  09/22/13 1840  PROBNP 633.8*   CBG: No results found for this basename: GLUCAP,  in the last 168 hours  Recent Results (from the past 240 hour(s))  URINE CULTURE     Status: None   Collection Time    09/22/13  4:44 PM      Result Value Ref Range Status   Culture ESCHERICHIA COLI   Final   Colony Count >=100,000 COLONIES/ML   Final   Organism ID, Bacteria ESCHERICHIA COLI   Final  URINE CULTURE     Status: None   Collection Time    09/22/13  7:57 PM      Result Value Ref  Range Status   Specimen Description URINE, CLEAN CATCH   Final   Special Requests NONE   Final   Culture  Setup Time     Final   Value: 09/23/2013 01:19     Performed at [place]     Final   Value: >=100,000 COLONIES/ML     Performed at [facility]   Culture     Final   Value: ESCHERICHIA COLI     Performed at [facility]   Report Status 09/25/2013 FINAL   Final   Organism ID, Bacteria ESCHERICHIA COLI   Final  RESPIRATORY VIRUS PANEL     Status: Abnormal   Collection Time    09/22/13 11:01 PM      Result Value Ref Range Status   Source - RVPAN NASOPHARYNGEAL   Final   Respiratory Syncytial Virus A NOT DETECTED   Final   Respiratory Syncytial Virus B NOT DETECTED   Final   Influenza A NOT DETECTED   Final   Influenza B NOT DETECTED   Final   Parainfluenza 1 NOT DETECTED   Final    Parainfluenza 2 NOT DETECTED   Final   Parainfluenza 3 NOT DETECTED   Final   Metapneumovirus DETECTED (*)  Final   Rhinovirus NOT DETECTED   Final   Adenovirus NOT DETECTED   Final   Influenza A H1 NOT DETECTED   Final   Influenza A H3 NOT DETECTED   Final   Comment: (NOTE)           Normal Reference Range for each Analyte: NOT DETECTED     Testing performed using the Luminex xTAG Respiratory Viral Panel test     kit.     This test was developed and its performance characteristics determined     by [facility]. It has not been cleared or approved by the [place]     Food and Drug Administration. This test is used for clinical purposes.     It should not be regarded as investigational or for research. This     laboratory is certified under the [place] (CLIA) as qualified to perform high complexity     clinical laboratory testing.     Performed at [place], BLOOD (ROUTINE X 2)     Status: None   Collection Time    09/23/13 12:08 AM      Result Value Ref Range Status   Specimen Description BLOOD LEFT HAND   Final   Special Requests BOTTLES DRAWN AEROBIC ONLY [facility]   Final   Culture  Setup Time     Final   Value: 09/23/2013 04:57     Performed at [facility]   Culture     Final   Value:        BLOOD CULTURE RECEIVED NO GROWTH TO DATE CULTURE WILL BE HELD FOR 5 DAYS BEFORE ISSUING A FINAL NEGATIVE REPORT     Performed at [facility]   Report Status PENDING   Incomplete  CULTURE, BLOOD (ROUTINE X 2)     Status: None   Collection Time    09/23/13 12:12 AM      Result Value Ref Range Status   Specimen Description BLOOD LEFT ANTECUBITAL   Final   Special Requests BOTTLES DRAWN AEROBIC AND ANAEROBIC [place]   Final   Culture  Setup Time  Final   Value: 09/23/2013 04:56     Performed at [facility]   Culture     Final   Value:        BLOOD CULTURE RECEIVED NO GROWTH TO DATE CULTURE WILL BE  HELD FOR 5 DAYS BEFORE ISSUING A FINAL NEGATIVE REPORT     Performed at [facility]   Report Status PENDING   Incomplete     Studies: Dg Chest 2 View  09/24/2013   CLINICAL DATA:  Cough, shortness of breath, dementia, COPD  EXAM: CHEST  2 VIEW  COMPARISON:  09/22/2013  FINDINGS: Enlargement of cardiac silhouette.  Calcified tortuous aorta.  Pulmonary vascularity normal.  Emphysematous and bronchitic changes consistent with COPD.  New right lower lobe and left lower lobe infiltrates consistent with pneumonia.  Small pleural effusions bilaterally.  Upper lungs clear.  No pneumothorax.  Bones demineralized.  IMPRESSION: COPD changes with bilateral lower lobe consolidation consistent with pneumonia, new.  Bilateral pleural effusions.   Electronically Signed   By: Lavonia Dana M.D.   On: 09/24/2013 10:09    Scheduled Meds: . azithromycin  500 mg Intravenous Q24H  . budesonide-formoterol  2 puff Inhalation BID  . calcium-vitamin D  1 tablet Oral Daily  . cefTRIAXone (ROCEPHIN)  IV  1 g Intravenous Q24H  . donepezil  5 mg Oral BID  . enoxaparin (LOVENOX) injection  40 mg Subcutaneous Q24H  . fluticasone  2 spray Each Nare Daily   Continuous Infusions:    Principal Problem:   Encephalopathy, metabolic Active Problems:   COPD   OSTEOPOROSIS   Dementia   Fever   Cough   Toxic metabolic encephalopathy    Time spent: >35 minutes     Kinnie Feil  [facility] Pager [phone]. If 7PM-7AM, please contact night-coverage at [email or web address], password [facility] 09/25/2013, 10:38 AM  LOS: 3 days

## 2013-09-26 NOTE — Progress Notes (Signed)
[facility] PROGRESS NOTE  Donna Flowers EHO:[id number] DOB: 02/08/1928 DOA: 09/22/2013 PCP: Kathlene November, MD  Assessment/Plan: 80 y.o. female with PMH of Dementia, COPD, Osteoporosis, lives independently, she has short term memory loss at baseline  1. BL pneumonia; Initial CXR: unremarkable; influenza neg, but +Metapneumovirus -repeat CXR showed BL pneumonia; cont empiric atx, bronchodilators; blood c/s NGTD  2. Metabolic encephalopathy symptoms concerning for early pneumonia  -neuro exam no focal; mild confusion at baseline; cont monitoring   3. COPD stable, nebs PRN   4. Dementia  with worsening mentation due to #1  -continue aricept at risk for increasing delirium/agitation; monitor   5. UTI, cont atx; p+ E coli    Code Status: DNR Family Communication: D/W PATIENT, called updated Donna Flowers, Donna Flowers [phone]  (indicate person spoken with, relationship, and if by phone, the number) Disposition Plan: pend clinical improvemen t 24-48 hours    Consultants:  None   Procedures:  noen   Antibiotics:  ceftriaxone  Azithromycin   (indicate start date, and stop date if known)  HPI/Subjective: Alert, but confused  Objective: Filed Vitals:   09/26/13 0740  BP: 131/59  Pulse: 73  Temp: 98.8 F (37.1 C)  Resp: 16    Intake/Output Summary (Last 24 hours) at 09/26/13 0937 Last data filed at 09/26/13 0800  Gross per 24 hour  Intake    600 ml  Output      0 ml  Net    600 ml   Filed Weights   09/24/13 0853  Weight: 62.5 kg (137 lb 12.6 oz)    Exam:   General:  alert  Cardiovascular: s1,s2 rrr  Respiratory: BL rales   Abdomen: soft, nt,nd   Musculoskeletal: no LE edema   Data Reviewed: Basic Metabolic Panel:  Recent Labs Lab 09/22/13 1840 09/23/13 0419 09/24/13 0403  NA 138 141 140  K 3.8 3.3* 3.5*  CL 98 103 105  CO2 _0 GLUCOSE 99 99 80  BUN _1 CREATININE 0.71 0.76 0.68  CALCIUM 9.1 8.1* 8.1*   Liver  Function Tests:  Recent Labs Lab 09/22/13 1840  AST 24  ALT 12  ALKPHOS 99  BILITOT 0.3  PROT 6.6  ALBUMIN 3.4*   No results found for this basename: LIPASE, AMYLASE,  in the last 168 hours No results found for this basename: AMMONIA,  in the last 168 hours CBC:  Recent Labs Lab 09/22/13 1840 09/23/13 0419 09/24/13 0403  WBC 9.6 11.0* 9.8  NEUTROABS 8.2*  --   --   HGB 13.9 12.2 12.1  HCT 40.4 36.9 35.9*  MCV 85.6 85.6 85.5  PLT 145* 129* 110*   Cardiac Enzymes:  Recent Labs Lab 09/22/13 1840  CKTOTAL 91  CKMB 1.2   BNP (last 3 results)  Recent Labs  09/22/13 1840  PROBNP 633.8*   CBG: No results found for this basename: GLUCAP,  in the last 168 hours  Recent Results (from the past 240 hour(s))  URINE CULTURE     Status: None   Collection Time    09/22/13  4:44 PM      Result Value Ref Range Status   Culture ESCHERICHIA COLI   Final   Colony Count >=100,000 COLONIES/ML   Final   Organism ID, Bacteria ESCHERICHIA COLI   Final  URINE CULTURE     Status: None   Collection Time    09/22/13  7:57 PM      Result Value Ref  Range Status   Specimen Description URINE, CLEAN CATCH   Final   Special Requests NONE   Final   Culture  Setup Time     Final   Value: 09/23/2013 01:19     Performed at [place]     Final   Value: >=100,000 COLONIES/ML     Performed at [facility]   Culture     Final   Value: ESCHERICHIA COLI     Performed at [facility]   Report Status 09/25/2013 FINAL   Final   Organism ID, Bacteria ESCHERICHIA COLI   Final  RESPIRATORY VIRUS PANEL     Status: Abnormal   Collection Time    09/22/13 11:01 PM      Result Value Ref Range Status   Source - RVPAN NASOPHARYNGEAL   Final   Respiratory Syncytial Virus A NOT DETECTED   Final   Respiratory Syncytial Virus B NOT DETECTED   Final   Influenza A NOT DETECTED   Final   Influenza B NOT DETECTED   Final   Parainfluenza 1 NOT DETECTED   Final    Parainfluenza 2 NOT DETECTED   Final   Parainfluenza 3 NOT DETECTED   Final   Metapneumovirus DETECTED (*)  Final   Rhinovirus NOT DETECTED   Final   Adenovirus NOT DETECTED   Final   Influenza A H1 NOT DETECTED   Final   Influenza A H3 NOT DETECTED   Final   Comment: (NOTE)           Normal Reference Range for each Analyte: NOT DETECTED     Testing performed using the Luminex xTAG Respiratory Viral Panel test     kit.     This test was developed and its performance characteristics determined     by [facility]. It has not been cleared or approved by the [place]     Food and Drug Administration. This test is used for clinical purposes.     It should not be regarded as investigational or for research. This     laboratory is certified under the [place] (CLIA) as qualified to perform high complexity     clinical laboratory testing.     Performed at [place], BLOOD (ROUTINE X 2)     Status: None   Collection Time    09/23/13 12:08 AM      Result Value Ref Range Status   Specimen Description BLOOD LEFT HAND   Final   Special Requests BOTTLES DRAWN AEROBIC ONLY [facility]   Final   Culture  Setup Time     Final   Value: 09/23/2013 04:57     Performed at [facility]   Culture     Final   Value:        BLOOD CULTURE RECEIVED NO GROWTH TO DATE CULTURE WILL BE HELD FOR 5 DAYS BEFORE ISSUING A FINAL NEGATIVE REPORT     Performed at [facility]   Report Status PENDING   Incomplete  CULTURE, BLOOD (ROUTINE X 2)     Status: None   Collection Time    09/23/13 12:12 AM      Result Value Ref Range Status   Specimen Description BLOOD LEFT ANTECUBITAL   Final   Special Requests BOTTLES DRAWN AEROBIC AND ANAEROBIC [place]   Final   Culture  Setup Time  Final   Value: 09/23/2013 04:56     Performed at [facility]   Culture     Final   Value:        BLOOD CULTURE RECEIVED NO GROWTH TO DATE CULTURE WILL BE  HELD FOR 5 DAYS BEFORE ISSUING A FINAL NEGATIVE REPORT     Performed at [facility]   Report Status PENDING   Incomplete     Studies: No results found.  Scheduled Meds: . azithromycin  500 mg Intravenous Q24H  . budesonide-formoterol  2 puff Inhalation BID  . calcium-vitamin D  1 tablet Oral Daily  . cefTRIAXone (ROCEPHIN)  IV  1 g Intravenous Q24H  . donepezil  5 mg Oral BID  . enoxaparin (LOVENOX) injection  40 mg Subcutaneous Q24H  . fluticasone  2 spray Each Nare Daily   Continuous Infusions:    Principal Problem:   Encephalopathy, metabolic Active Problems:   COPD   OSTEOPOROSIS   Dementia   Fever   Cough   Toxic metabolic encephalopathy    Time spent: >35 minutes     Kinnie Feil  [facility] Pager [phone]. If 7PM-7AM, please contact night-coverage at [email or web address], password [facility] 09/26/2013, 9:37 AM  LOS: 4 days

## 2013-09-27 NOTE — Progress Notes (Signed)
[facility] PROGRESS NOTE  TARYNE KIGER ZRA:[id number] DOB: 04-Nov-1927 DOA: 09/22/2013 PCP: Kathlene November, MD  Assessment/Plan: 80 y.o. female with PMH of Dementia, COPD, Osteoporosis, lives independently, she has short term memory loss at baseline  1. BL pneumonia; Initial CXR: unremarkable; influenza neg, but +Metapneumovirus -repeat CXR showed BL pneumonia; improving on empiric atx, bronchodilators; blood c/s NGTD  2. Metabolic encephalopathy symptoms concerning for early pneumonia  -neuro exam no focal; mild confusion at baseline; cont monitoring   3. COPD stable, nebs PRN   4. Dementia  with worsening mentation due to #1  -continue aricept at risk for increasing delirium/agitation; monitor   5. UTI, cont atx; p+ E coli    D/w patient, her son; patient has intermittent confusion with dementia; son is willing to provide 24 hrs care at home, and wants her to d/c home  -plan HHC upon d/c likely on 4/20  Code Status: DNR Family Communication: D/W PATIENT, called updated [place], [place] [phone]  (indicate person spoken with, relationship, and if by phone, the number) Disposition Plan: pend clinical improvemen t 24-48 hours    Consultants:  None   Procedures:  none   Antibiotics:  ceftriaxone  Azithromycin   (indicate start date, and stop date if known)  HPI/Subjective: Alert, but confused  Objective: Filed Vitals:   09/27/13 0647  BP: 152/68  Pulse: 89  Temp: 97.8 F (36.6 C)  Resp: 18    Intake/Output Summary (Last 24 hours) at 09/27/13 0915 Last data filed at 09/26/13 1230  Gross per 24 hour  Intake    120 ml  Output      0 ml  Net    120 ml   Filed Weights   09/24/13 0853  Weight: 62.5 kg (137 lb 12.6 oz)    Exam:   General:  alert  Cardiovascular: s1,s2 rrr  Respiratory: BL rales   Abdomen: soft, nt,nd   Musculoskeletal: no LE edema   Data Reviewed: Basic Metabolic Panel:  Recent Labs Lab 09/22/13 1840  09/23/13 0419 09/24/13 0403  NA 138 141 140  K 3.8 3.3* 3.5*  CL 98 103 105  CO2 _0 GLUCOSE 99 99 80  BUN _1 CREATININE 0.71 0.76 0.68  CALCIUM 9.1 8.1* 8.1*   Liver Function Tests:  Recent Labs Lab 09/22/13 1840  AST 24  ALT 12  ALKPHOS 99  BILITOT 0.3  PROT 6.6  ALBUMIN 3.4*   No results found for this basename: LIPASE, AMYLASE,  in the last 168 hours No results found for this basename: AMMONIA,  in the last 168 hours CBC:  Recent Labs Lab 09/22/13 1840 09/23/13 0419 09/24/13 0403  WBC 9.6 11.0* 9.8  NEUTROABS 8.2*  --   --   HGB 13.9 12.2 12.1  HCT 40.4 36.9 35.9*  MCV 85.6 85.6 85.5  PLT 145* 129* 110*   Cardiac Enzymes:  Recent Labs Lab 09/22/13 1840  CKTOTAL 91  CKMB 1.2   BNP (last 3 results)  Recent Labs  09/22/13 1840  PROBNP 633.8*   CBG: No results found for this basename: GLUCAP,  in the last 168 hours  Recent Results (from the past 240 hour(s))  URINE CULTURE     Status: None   Collection Time    09/22/13  4:44 PM      Result Value Ref Range Status   Culture ESCHERICHIA COLI   Final   Colony Count >=100,000 COLONIES/ML   Final  Organism ID, Bacteria ESCHERICHIA COLI   Final  URINE CULTURE     Status: None   Collection Time    09/22/13  7:57 PM      Result Value Ref Range Status   Specimen Description URINE, CLEAN CATCH   Final   Special Requests NONE   Final   Culture  Setup Time     Final   Value: 09/23/2013 01:19     Performed at [facility]     Final   Value: >=100,000 COLONIES/ML     Performed at [facility]   Culture     Final   Value: ESCHERICHIA COLI     Performed at [facility]   Report Status 09/25/2013 FINAL   Final   Organism ID, Bacteria ESCHERICHIA COLI   Final  RESPIRATORY VIRUS PANEL     Status: Abnormal   Collection Time    09/22/13 11:01 PM      Result Value Ref Range Status   Source - RVPAN NASOPHARYNGEAL   Final   Respiratory Syncytial Virus  A NOT DETECTED   Final   Respiratory Syncytial Virus B NOT DETECTED   Final   Influenza A NOT DETECTED   Final   Influenza B NOT DETECTED   Final   Parainfluenza 1 NOT DETECTED   Final   Parainfluenza 2 NOT DETECTED   Final   Parainfluenza 3 NOT DETECTED   Final   Metapneumovirus DETECTED (*)  Final   Rhinovirus NOT DETECTED   Final   Adenovirus NOT DETECTED   Final   Influenza A H1 NOT DETECTED   Final   Influenza A H3 NOT DETECTED   Final   Comment: (NOTE)           Normal Reference Range for each Analyte: NOT DETECTED     Testing performed using the Luminex xTAG Respiratory Viral Panel test     kit.     This test was developed and its performance characteristics determined     by [facility]. It has not been cleared or approved by the [place]     Food and Drug Administration. This test is used for clinical purposes.     It should not be regarded as investigational or for research. This     laboratory is certified under the [place] (CLIA) as qualified to perform high complexity     clinical laboratory testing.     Performed at [place], BLOOD (ROUTINE X 2)     Status: None   Collection Time    09/23/13 12:08 AM      Result Value Ref Range Status   Specimen Description BLOOD LEFT HAND   Final   Special Requests BOTTLES DRAWN AEROBIC ONLY [facility]   Final   Culture  Setup Time     Final   Value: 09/23/2013 04:57     Performed at [facility]   Culture     Final   Value:        BLOOD CULTURE RECEIVED NO GROWTH TO DATE CULTURE WILL BE HELD FOR 5 DAYS BEFORE ISSUING A FINAL NEGATIVE REPORT     Performed at [facility]   Report Status PENDING   Incomplete  CULTURE, BLOOD (ROUTINE X 2)     Status: None   Collection Time    09/23/13 12:12 AM  Result Value Ref Range Status   Specimen Description BLOOD LEFT ANTECUBITAL   Final   Special Requests BOTTLES DRAWN AEROBIC AND ANAEROBIC [facility]   Final    Culture  Setup Time     Final   Value: 09/23/2013 04:56     Performed at [facility]   Culture     Final   Value:        BLOOD CULTURE RECEIVED NO GROWTH TO DATE CULTURE WILL BE HELD FOR 5 DAYS BEFORE ISSUING A FINAL NEGATIVE REPORT     Performed at [facility]   Report Status PENDING   Incomplete     Studies: No results found.  Scheduled Meds: . azithromycin  500 mg Intravenous Q24H  . budesonide-formoterol  2 puff Inhalation BID  . calcium-vitamin D  1 tablet Oral Daily  . cefTRIAXone (ROCEPHIN)  IV  1 g Intravenous Q24H  . donepezil  5 mg Oral BID  . enoxaparin (LOVENOX) injection  40 mg Subcutaneous Q24H  . fluticasone  2 spray Each Nare Daily   Continuous Infusions:    Principal Problem:   Encephalopathy, metabolic Active Problems:   COPD   OSTEOPOROSIS   Dementia   Fever   Cough   Toxic metabolic encephalopathy    Time spent: >35 minutes     Kinnie Feil  [facility] Pager [phone]. If 7PM-7AM, please contact night-coverage at [email or web address], password [facility] 09/27/2013, 9:15 AM  LOS: 5 days

## 2013-09-28 MED ORDER — ALBUTEROL SULFATE HFA 108 (90 BASE) MCG/ACT IN AERS: 2.0000 | INHALATION_SPRAY | Freq: Four times a day (QID) | RESPIRATORY_TRACT | Status: DC | PRN

## 2013-09-28 MED ORDER — ACETAMINOPHEN 325 MG PO TABS: 650.0000 mg | ORAL_TABLET | Freq: Four times a day (QID) | ORAL | Status: DC | PRN

## 2013-09-28 MED ORDER — BUDESONIDE-FORMOTEROL FUMARATE 80-4.5 MCG/ACT IN AERO: 2.0000 | INHALATION_SPRAY | Freq: Two times a day (BID) | RESPIRATORY_TRACT | Status: DC

## 2013-09-28 MED ORDER — AMOXICILLIN-POT CLAVULANATE 875-125 MG PO TABS: 1.0000 | ORAL_TABLET | Freq: Two times a day (BID) | ORAL | Status: DC

## 2013-09-28 NOTE — Care Management Note (Signed)
    Page 1 of 1   09/28/2013     12:36:04 PM CARE MANAGEMENT NOTE 09/28/2013  Patient:  Donna Flowers, Donna Flowers   Account Number:  [id number]  Date Initiated:  09/23/2013  Documentation initiated by:  [facility]  Subjective/Objective Assessment:   80 year old female admitted with possible early pneumonia.     Action/Plan:   From home alone. Home health services at d.c   Anticipated DC Date:  09/28/2013   Anticipated DC Plan:  [place]  CM consult      Choice offered to / List presented to:  C-4 Adult Children   DME arranged  [facility]      DME agency  [place] arranged  [place] agency  [facility]   Status of service:  In process, will continue to follow Medicare Important Message given?  NA - LOS <3 / Initial given by admissions (If response is "NO", the following Medicare IM given date fields will be blank) Date Medicare IM given:   Date Additional Medicare IM given:    Discharge Disposition:  [place]  Per UR Regulation:  Reviewed for med. necessity/level of care/duration of stay  If discussed at [place] of Stay Meetings, dates discussed:    Comments:

## 2013-09-28 NOTE — Discharge Summary (Signed)
Physician Discharge Summary  Donna Flowers SWF:[id number] DOB: 06-Jun-1928 DOA: 09/22/2013  PCP: Kathlene November, MD  Admit date: 09/22/2013 Discharge date: 09/28/2013  Time spent: >35 minutes  Recommendations for Outpatient Follow-up:  HHC F/u with PCP in 1-2 weeks  Discharge Diagnoses:  Principal Problem:   Encephalopathy, metabolic Active Problems:   COPD   OSTEOPOROSIS   Dementia   Fever   Cough   Toxic metabolic encephalopathy   Discharge Condition: stable   Diet recommendation: heart healthy   Filed Weights   09/24/13 0853  Weight: 62.5 kg (137 lb 12.6 oz)    History of present illness:  80 y.o. female with PMH of Dementia, COPD, Osteoporosis, lives independently, she has short term memory loss at baseline was admitted with pneumonia, UTI    Hospital Course:  1. BL pneumonia; Initial CXR: unremarkable; influenza neg, but +Metapneumovirus  -repeat CXR showed BL pneumonia; likely viral+bacterila;  -slowly improved on empiric atx, bronchodilators; blood c/s NGTD  -ned repeat CXR in 4-6 weeks;  2. Metabolic encephalopathy symptoms concerning for early pneumonia  -resolved; neuro exam no focal; mild confusion at baseline;  3. COPD stable, nebs PRN  4. Dementia with worsening mentation due to #1  -continue aricept, stable  5. UTI, cont atx; p+ E coli   D/w patient, her son; patient has intermittent confusion with dementia;  -son is willing to provide 24 hrs care at home, and wants her to d/c home    Procedures:  none (i.e. Studies not automatically included, echos, thoracentesis, etc; not x-rays)  Consultations:  none  Discharge Exam: Filed Vitals:   09/28/13 0525  BP: 150/62  Pulse: 68  Temp: 98.3 F (36.8 C)  Resp: 18    General: alert Cardiovascular: s1,s2 rrr Respiratory: few crackles in R lung improved   Discharge Instructions  Discharge Orders   Future Orders Complete By Expires   Diet - low sodium heart healthy  As directed    Discharge  instructions  As directed    Increase activity slowly  As directed        Medication List         acetaminophen 325 MG tablet  Commonly known as:  TYLENOL  Take 2 tablets (650 mg total) by mouth every 6 (six) hours as needed for mild pain (or Fever >/= 101).     albuterol 108 (90 BASE) MCG/ACT inhaler  Commonly known as:  PROVENTIL HFA;VENTOLIN HFA  Inhale 2 puffs into the lungs every 6 (six) hours as needed for wheezing or shortness of breath.     amoxicillin-clavulanate 875-125 MG per tablet  Commonly known as:  AUGMENTIN  Take 1 tablet by mouth 2 (two) times daily.     budesonide-formoterol 80-4.5 MCG/ACT inhaler  Commonly known as:  SYMBICORT  Inhale 2 puffs into the lungs 2 (two) times daily.     calcium-vitamin D 500-200 MG-UNIT per tablet  Commonly known as:  OSCAL WITH D  Take 1 tablet by mouth daily.     donepezil 5 MG tablet  Commonly known as:  ARICEPT  Take 5 mg by mouth 2 (two) times daily.     multivitamin with minerals tablet  Take 1 tablet by mouth daily.       No Known Allergies     Follow-up Information   Follow up with Kathlene November, MD In 1 week.   Specialty:  Internal Medicine   Contact information:   [phone] [address] [place] [address]  [phone]        The results of significant diagnostics from this hospitalization (including imaging, microbiology, ancillary and laboratory) are listed below for reference.    Significant Diagnostic Studies: Dg Chest 2 View  09/24/2013   CLINICAL DATA:  Cough, shortness of breath, dementia, COPD  EXAM: CHEST  2 VIEW  COMPARISON:  09/22/2013  FINDINGS: Enlargement of cardiac silhouette.  Calcified tortuous aorta.  Pulmonary vascularity normal.  Emphysematous and bronchitic changes consistent with COPD.  New right lower lobe and left lower lobe infiltrates consistent with pneumonia.  Small pleural effusions bilaterally.  Upper lungs clear.  No pneumothorax.  Bones demineralized.   IMPRESSION: COPD changes with bilateral lower lobe consolidation consistent with pneumonia, new.  Bilateral pleural effusions.   Electronically Signed   By: Lavonia Dana M.D.   On: 09/24/2013 10:09   Dg Chest 2 View  09/22/2013   COUGH, THIN CLINICAL DATA: Shin  EXAM: CHEST  2 VIEW  COMPARISON:  DG CHEST 2 VIEW dated 10/04/2011; DG CHEST 2 VIEW dated 04/24/2004; DG CHEST 2 VIEW dated 10/06/2002  FINDINGS: The lungs are hyperinflated likely secondary to COPD. There are bilateral chronic bronchitic changes. There is no focal parenchymal opacity, pleural effusion, or pneumothorax. The heart and mediastinal contours are unremarkable.  The osseous structures are unremarkable.  IMPRESSION: No active cardiopulmonary disease.   Electronically Signed   By: Kathreen Devoid   On: 09/22/2013 22:20    Microbiology: Recent Results (from the past 240 hour(s))  URINE CULTURE     Status: None   Collection Time    09/22/13  4:44 PM      Result Value Ref Range Status   Culture ESCHERICHIA COLI   Final   Colony Count >=100,000 COLONIES/ML   Final   Organism ID, Bacteria ESCHERICHIA COLI   Final  URINE CULTURE     Status: None   Collection Time    09/22/13  7:57 PM      Result Value Ref Range Status   Specimen Description URINE, CLEAN CATCH   Final   Special Requests NONE   Final   Culture  Setup Time     Final   Value: 09/23/2013 01:19     Performed at [facility]     Final   Value: >=100,000 COLONIES/ML     Performed at [facility]   Culture     Final   Value: ESCHERICHIA COLI     Performed at [facility]   Report Status 09/25/2013 FINAL   Final   Organism ID, Bacteria ESCHERICHIA COLI   Final  RESPIRATORY VIRUS PANEL     Status: Abnormal   Collection Time    09/22/13 11:01 PM      Result Value Ref Range Status   Source - [facility] NASOPHARYNGEAL   Final   Respiratory Syncytial Virus A NOT DETECTED   Final   Respiratory Syncytial Virus B NOT DETECTED   Final    Influenza A NOT DETECTED   Final   Influenza B NOT DETECTED   Final   Parainfluenza 1 NOT DETECTED   Final   Parainfluenza 2 NOT DETECTED   Final   Parainfluenza 3 NOT DETECTED   Final   Metapneumovirus DETECTED (*)  Final   Rhinovirus NOT DETECTED   Final   Adenovirus NOT DETECTED   Final   Influenza A H1 NOT DETECTED   Final   Influenza A H3 NOT DETECTED  Final   Comment: (NOTE)           Normal Reference Range for each Analyte: NOT DETECTED     Testing performed using the Luminex xTAG Respiratory Viral Panel test     kit.     This test was developed and its performance characteristics determined     by [facility]. It has not been cleared or approved by the [place]     Food and Drug Administration. This test is used for clinical purposes.     It should not be regarded as investigational or for research. This     laboratory is certified under the [place] (CLIA) as qualified to perform high complexity     clinical laboratory testing.     Performed at [facility], BLOOD (ROUTINE X 2)     Status: None   Collection Time    09/23/13 12:08 AM      Result Value Ref Range Status   Specimen Description BLOOD LEFT HAND   Final   Special Requests BOTTLES DRAWN AEROBIC ONLY 6CC   Final   Culture  Setup Time     Final   Value: 09/23/2013 04:57     Performed at [facility]   Culture     Final   Value:        BLOOD CULTURE RECEIVED NO GROWTH TO DATE CULTURE WILL BE HELD FOR 5 DAYS BEFORE ISSUING A FINAL NEGATIVE REPORT     Performed at [facility]   Report Status PENDING   Incomplete  CULTURE, BLOOD (ROUTINE X 2)     Status: None   Collection Time    09/23/13 12:12 AM      Result Value Ref Range Status   Specimen Description BLOOD LEFT ANTECUBITAL   Final   Special Requests BOTTLES DRAWN AEROBIC AND ANAEROBIC 6CC   Final   Culture  Setup Time     Final   Value: 09/23/2013 04:56     Performed at  [facility]   Culture     Final   Value:        BLOOD CULTURE RECEIVED NO GROWTH TO DATE CULTURE WILL BE HELD FOR 5 DAYS BEFORE ISSUING A FINAL NEGATIVE REPORT     Performed at [facility]   Report Status PENDING   Incomplete     Labs: Basic Metabolic Panel:  Recent Labs Lab 09/22/13 1840 09/23/13 0419 09/24/13 0403  NA 138 141 140  K 3.8 3.3* 3.5*  CL 98 103 105  CO2 _0 GLUCOSE 99 99 80  BUN _1 CREATININE 0.71 0.76 0.68  CALCIUM 9.1 8.1* 8.1*   Liver Function Tests:  Recent Labs Lab 09/22/13 1840  AST 24  ALT 12  ALKPHOS 99  BILITOT 0.3  PROT 6.6  ALBUMIN 3.4*   No results found for this basename: LIPASE, AMYLASE,  in the last 168 hours No results found for this basename: AMMONIA,  in the last 168 hours CBC:  Recent Labs Lab 09/22/13 1840 09/23/13 0419 09/24/13 0403  WBC 9.6 11.0* 9.8  NEUTROABS 8.2*  --   --   HGB 13.9 12.2 12.1  HCT 40.4 36.9 35.9*  MCV 85.6 85.6 85.5  PLT 145* 129* 110*   Cardiac Enzymes:  Recent Labs Lab 09/22/13 1840  CKTOTAL 91  CKMB 1.2   BNP: BNP (last 3 results)  Recent Labs  09/22/13 1840  PROBNP 633.8*   CBG: No results found for this basename: GLUCAP,  in the last 168 hours     Signed:  Kinnie Feil  [facility] 09/28/2013, 8:03 AM

## 2013-09-28 NOTE — Progress Notes (Signed)
SATURATION QUALIFICATIONS: (This note is used to comply with regulatory documentation for home oxygen)  Patient Saturations on Room Air at Rest = 87%  Patient Saturations on Room Air while Ambulating = 84%  Patient Saturations on 3 Liters of oxygen while Ambulating = 88-89%  Patient Saturations on 4 Liters of oxygen while Ambulating = 90-93%  Please briefly explain why patient needs home oxygen: Sitting up in bed at rest patient's Sats are 87%, while walking in the hall on room air her Sats dropped to 84%, it wasn't until I put her on 4 Liters of oxygen that her Sats reached above 90%.  I tried to ween her of oxygen the past two days and just taking her down to 2 liters from 3 made her Sats drop into the mid 80s at rest.  Gave her an incentive spirometer and she can only get the numbers up to 500-750, and most of the time she does it wrong, Patient has Hx of dementia.  Patient is being discharged home today and doesn't have oxygen available to her in her home.

## 2013-09-28 NOTE — Progress Notes (Signed)
[facility]  Patient Status: inpatient  [facility] is providing the following services: Oxygen (patient declined rw - she has one at home)  If patient discharges after hours, please call [phone].   Linward Headland 09/28/2013, 10:59 AM

## 2013-09-29 ENCOUNTER — Telehealth: Payer: Self-pay | Admitting: Internal Medicine

## 2013-09-29 LAB — CULTURE, BLOOD (ROUTINE X 2)
Culture: NO GROWTH
Culture: NO GROWTH

## 2013-09-29 NOTE — Telephone Encounter (Signed)
If she needs to be on a rehabilitation facility I'll be happy to fill any paperwork needed.

## 2013-09-29 NOTE — Telephone Encounter (Signed)
Patient son called and stated that his mother needs to be put into a short term rehab facility. Please advise.

## 2013-09-29 NOTE — Telephone Encounter (Signed)
Zigmund Daniel from [facility] called to inform dr Larose Kells that a FL2 was not filled out for patient due to her son requesting she be placed into [facility] facility. Zigmund Daniel would like for you to call her if you have any further questions. Please advise.

## 2013-09-30 DIAGNOSIS — F0390 Unspecified dementia without behavioral disturbance: Secondary | ICD-10-CM | POA: Diagnosis not present

## 2013-09-30 DIAGNOSIS — R262 Difficulty in walking, not elsewhere classified: Secondary | ICD-10-CM | POA: Diagnosis not present

## 2013-09-30 DIAGNOSIS — J189 Pneumonia, unspecified organism: Secondary | ICD-10-CM | POA: Diagnosis not present

## 2013-09-30 DIAGNOSIS — N390 Urinary tract infection, site not specified: Secondary | ICD-10-CM | POA: Diagnosis not present

## 2013-09-30 DIAGNOSIS — M810 Age-related osteoporosis without current pathological fracture: Secondary | ICD-10-CM | POA: Diagnosis not present

## 2013-09-30 DIAGNOSIS — J441 Chronic obstructive pulmonary disease with (acute) exacerbation: Secondary | ICD-10-CM | POA: Diagnosis not present

## 2013-09-30 NOTE — Telephone Encounter (Addendum)
Patient son called and stated that he needs to make a hospital follow up for his mom. Patient was discharged from [facility] on 09/22/2013. Also his son stated that she is having problems with bladder control and wanted to see if Dr Larose Kells would prescribe something for her. Please advise.

## 2013-10-01 NOTE — Telephone Encounter (Signed)
Spoke with Neoma Laming, RN for [facility], gave verbal order for PT/OT/MSW/ ST.

## 2013-10-01 NOTE — Telephone Encounter (Signed)
Spoke with patients daughter in law, hospital follow up appt made for Monday 10/05/13 at 11:30. Daughter stated that she felt pts urinary incontience was brought on by deconditioning from being hospitalized. She reported that pt had no burning, frequency or pain with urination.

## 2013-10-02 DIAGNOSIS — F0390 Unspecified dementia without behavioral disturbance: Secondary | ICD-10-CM | POA: Diagnosis not present

## 2013-10-02 DIAGNOSIS — N390 Urinary tract infection, site not specified: Secondary | ICD-10-CM | POA: Diagnosis not present

## 2013-10-02 DIAGNOSIS — J189 Pneumonia, unspecified organism: Secondary | ICD-10-CM | POA: Diagnosis not present

## 2013-10-02 DIAGNOSIS — M810 Age-related osteoporosis without current pathological fracture: Secondary | ICD-10-CM | POA: Diagnosis not present

## 2013-10-02 DIAGNOSIS — R262 Difficulty in walking, not elsewhere classified: Secondary | ICD-10-CM | POA: Diagnosis not present

## 2013-10-02 DIAGNOSIS — J441 Chronic obstructive pulmonary disease with (acute) exacerbation: Secondary | ICD-10-CM | POA: Diagnosis not present

## 2013-10-05 ENCOUNTER — Ambulatory Visit (INDEPENDENT_AMBULATORY_CARE_PROVIDER_SITE_OTHER): Payer: Medicare Other | Admitting: Internal Medicine

## 2013-10-05 ENCOUNTER — Encounter: Payer: Self-pay | Admitting: Internal Medicine

## 2013-10-05 VITALS — BP 153/71 | HR 76 | Temp 98.0°F | Wt 121.0 lb

## 2013-10-05 DIAGNOSIS — J449 Chronic obstructive pulmonary disease, unspecified: Secondary | ICD-10-CM

## 2013-10-05 DIAGNOSIS — M810 Age-related osteoporosis without current pathological fracture: Secondary | ICD-10-CM

## 2013-10-05 DIAGNOSIS — J189 Pneumonia, unspecified organism: Secondary | ICD-10-CM | POA: Diagnosis not present

## 2013-10-05 DIAGNOSIS — N390 Urinary tract infection, site not specified: Secondary | ICD-10-CM

## 2013-10-05 DIAGNOSIS — R262 Difficulty in walking, not elsewhere classified: Secondary | ICD-10-CM | POA: Diagnosis not present

## 2013-10-05 DIAGNOSIS — F0390 Unspecified dementia without behavioral disturbance: Secondary | ICD-10-CM | POA: Diagnosis not present

## 2013-10-05 DIAGNOSIS — J441 Chronic obstructive pulmonary disease with (acute) exacerbation: Secondary | ICD-10-CM | POA: Diagnosis not present

## 2013-10-05 MED ORDER — IBANDRONATE SODIUM 150 MG PO TABS: 150.0000 mg | ORAL_TABLET | ORAL | Status: DC

## 2013-10-05 NOTE — Patient Instructions (Addendum)
Go back on Boniva once monthly Symbicort twice a day,  albuterol only as needed. You're due for a chest x-ray in one month from today, please be sure to call the office and schedule that Next visit with me in 3 months  Dr. Judy Pimple [facility] [address] [place], [place] [address]  [phone] or toll free [phone]

## 2013-10-05 NOTE — Assessment & Plan Note (Signed)
On nocturnal O2 after admission 4-15 for pneumonia

## 2013-10-05 NOTE — Assessment & Plan Note (Signed)
Mental status recently worsened by acute illness, going back to baseline

## 2013-10-05 NOTE — Assessment & Plan Note (Signed)
Restart Boniva, it was stoppedand hospital doing most recent admission

## 2013-10-05 NOTE — Progress Notes (Signed)
Subjective:    Patient ID: Donna Flowers, female    DOB: 1927/12/13, 80 y.o.   MRN: [id number]  DOS:  10/05/2013 Type of  visit: Hospital  followup, chart is reviewed, excerpts:   Admit date: 09/22/2013  Discharge date: 09/28/2013    History of present illness:  80 y.o. female with PMH of Dementia, COPD, Osteoporosis, lives independently, she has short term memory loss at baseline was admitted with pneumonia, UTI  Hospital Course:  1. BL pneumonia; Initial CXR: unremarkable; influenza neg, but +Metapneumovirus  -repeat CXR showed BL pneumonia; likely viral+bacterial  -slowly improved on empiric atx, bronchodilators; blood c/s NGTD  -ned repeat CXR in 4-6 weeks;  2. Metabolic encephalopathy symptoms concerning for early pneumonia  -resolved; neuro exam no focal; mild confusion at baseline;  3. COPD stable, nebs PRN  4. Dementia with worsening mentation due to #1  -continue aricept, stable  5. UTI, cont atx; p+ E coli    ROS She is here with her son [name], since she left the hospital she is doing well. She is back at home, has a Actuary as needed. Appetite is good. No fever, chills. No lower extremity edema. No nausea, vomiting, diarrhea. No dysuria or gross nocturia. Mental status is improving, not yet back to normal. Has some urinary incontinence, not yet back to normal.   Past Medical History  Diagnosis Date  . COPD (chronic obstructive pulmonary disease)   . Osteoporosis   . Dementia   . Complication of anesthesia     lot of confusion    Past Surgical History  Procedure Laterality Date  . Joint replacement      right hip, left knee  . Ankle surgery      right    History   Social History  . Marital Status: Widowed    Spouse Name: N/A    Number of Children: 2  . Years of Education: N/A   Occupational History  . elderly    Social History Main Topics  . Smoking status: Never Smoker   . Smokeless tobacco: Never Used  . Alcohol Use: No  . Drug Use: No    . Sexual Activity: Not on file   Other Topics Concern  . Not on file   Social History Narrative   Lives by herself.   Since admission 09-2013, has a sitter 8-5 pm.   [name] (son) [phone]                 Medication List       This list is accurate as of: 10/05/13  6:25 PM.  Always use your most recent med list.               acetaminophen 325 MG tablet  Commonly known as:  TYLENOL  Take 2 tablets (650 mg total) by mouth every 6 (six) hours as needed for mild pain (or Fever >/= 101).     albuterol 108 (90 BASE) MCG/ACT inhaler  Commonly known as:  PROVENTIL HFA;VENTOLIN HFA  Inhale 2 puffs into the lungs every 6 (six) hours as needed for wheezing or shortness of breath.     budesonide-formoterol 80-4.5 MCG/ACT inhaler  Commonly known as:  SYMBICORT  Inhale 2 puffs into the lungs 2 (two) times daily.     calcium-vitamin D 500-200 MG-UNIT per tablet  Commonly known as:  OSCAL WITH D  Take 1 tablet by mouth daily.     donepezil 5 MG tablet  Commonly known as:  ARICEPT  Take 5 mg by mouth 2 (two) times daily.     ibandronate 150 MG tablet  Commonly known as:  BONIVA  Take 1 tablet (150 mg total) by mouth every 30 (thirty) days. Take in the morning with a full glass of water, on an empty stomach, and do not take anything else by mouth or lie down for the next 30 min.     multivitamin with minerals tablet  Take 1 tablet by mouth daily.           Objective:   Physical Exam BP 153/71  Pulse 76  Temp(Src) 98 F (36.7 C)  Wt 121 lb (54.885 kg)  SpO2 96% General -- alert, well-developed, NAD,pleasantly demented.  Lungs -- normal respiratory effort, no intercostal retractions, no accessory muscle use, and few rhonchi, no wheezing  Heart-- normal rate, regular rhythm, no murmur.   Extremities-- no pretibial edema bilaterally ; very large toenails Neurologic--  Alert, cooperative, follow commands. Recognizes her son, not oriented in time, did not know the  president was. Did not know her address.      Assessment & Plan:   Pneumonia, UTI Status post admission with a pneumonia and UTI, finished outpatient antibiotics, all maintained. Plan:  Chest x-ray in 4 weeks.  Recommend to see a podiatrist for nail care

## 2013-10-05 NOTE — Assessment & Plan Note (Signed)
Improving after treatment of pneumonia and UTI

## 2013-10-05 NOTE — Progress Notes (Signed)
Pre visit review using our clinic review tool, if applicable. No additional management support is needed unless otherwise documented below in the visit note. 

## 2013-10-06 ENCOUNTER — Telehealth: Payer: Self-pay | Admitting: *Deleted

## 2013-10-07 DIAGNOSIS — J189 Pneumonia, unspecified organism: Secondary | ICD-10-CM | POA: Diagnosis not present

## 2013-10-07 DIAGNOSIS — R262 Difficulty in walking, not elsewhere classified: Secondary | ICD-10-CM | POA: Diagnosis not present

## 2013-10-07 DIAGNOSIS — J441 Chronic obstructive pulmonary disease with (acute) exacerbation: Secondary | ICD-10-CM | POA: Diagnosis not present

## 2013-10-07 DIAGNOSIS — N390 Urinary tract infection, site not specified: Secondary | ICD-10-CM | POA: Diagnosis not present

## 2013-10-07 DIAGNOSIS — F0390 Unspecified dementia without behavioral disturbance: Secondary | ICD-10-CM | POA: Diagnosis not present

## 2013-10-07 DIAGNOSIS — M810 Age-related osteoporosis without current pathological fracture: Secondary | ICD-10-CM | POA: Diagnosis not present

## 2013-10-08 DIAGNOSIS — N390 Urinary tract infection, site not specified: Secondary | ICD-10-CM | POA: Diagnosis not present

## 2013-10-08 DIAGNOSIS — R262 Difficulty in walking, not elsewhere classified: Secondary | ICD-10-CM | POA: Diagnosis not present

## 2013-10-08 DIAGNOSIS — M810 Age-related osteoporosis without current pathological fracture: Secondary | ICD-10-CM | POA: Diagnosis not present

## 2013-10-08 DIAGNOSIS — F0390 Unspecified dementia without behavioral disturbance: Secondary | ICD-10-CM | POA: Diagnosis not present

## 2013-10-08 DIAGNOSIS — J189 Pneumonia, unspecified organism: Secondary | ICD-10-CM | POA: Diagnosis not present

## 2013-10-08 DIAGNOSIS — J441 Chronic obstructive pulmonary disease with (acute) exacerbation: Secondary | ICD-10-CM | POA: Diagnosis not present

## 2013-10-09 DIAGNOSIS — J189 Pneumonia, unspecified organism: Secondary | ICD-10-CM | POA: Diagnosis not present

## 2013-10-09 DIAGNOSIS — R262 Difficulty in walking, not elsewhere classified: Secondary | ICD-10-CM | POA: Diagnosis not present

## 2013-10-09 DIAGNOSIS — F0390 Unspecified dementia without behavioral disturbance: Secondary | ICD-10-CM | POA: Diagnosis not present

## 2013-10-09 DIAGNOSIS — N390 Urinary tract infection, site not specified: Secondary | ICD-10-CM | POA: Diagnosis not present

## 2013-10-09 DIAGNOSIS — M810 Age-related osteoporosis without current pathological fracture: Secondary | ICD-10-CM | POA: Diagnosis not present

## 2013-10-09 DIAGNOSIS — J441 Chronic obstructive pulmonary disease with (acute) exacerbation: Secondary | ICD-10-CM | POA: Diagnosis not present

## 2013-10-12 DIAGNOSIS — N390 Urinary tract infection, site not specified: Secondary | ICD-10-CM | POA: Diagnosis not present

## 2013-10-12 DIAGNOSIS — F0390 Unspecified dementia without behavioral disturbance: Secondary | ICD-10-CM | POA: Diagnosis not present

## 2013-10-12 DIAGNOSIS — J189 Pneumonia, unspecified organism: Secondary | ICD-10-CM | POA: Diagnosis not present

## 2013-10-12 DIAGNOSIS — J441 Chronic obstructive pulmonary disease with (acute) exacerbation: Secondary | ICD-10-CM | POA: Diagnosis not present

## 2013-10-12 DIAGNOSIS — M810 Age-related osteoporosis without current pathological fracture: Secondary | ICD-10-CM | POA: Diagnosis not present

## 2013-10-12 DIAGNOSIS — R262 Difficulty in walking, not elsewhere classified: Secondary | ICD-10-CM | POA: Diagnosis not present

## 2013-10-13 ENCOUNTER — Telehealth: Payer: Self-pay | Admitting: *Deleted

## 2013-10-13 ENCOUNTER — Other Ambulatory Visit: Payer: Self-pay | Admitting: Internal Medicine

## 2013-10-13 DIAGNOSIS — F0390 Unspecified dementia without behavioral disturbance: Secondary | ICD-10-CM | POA: Diagnosis not present

## 2013-10-13 DIAGNOSIS — M810 Age-related osteoporosis without current pathological fracture: Secondary | ICD-10-CM | POA: Diagnosis not present

## 2013-10-13 DIAGNOSIS — J441 Chronic obstructive pulmonary disease with (acute) exacerbation: Secondary | ICD-10-CM | POA: Diagnosis not present

## 2013-10-13 DIAGNOSIS — R262 Difficulty in walking, not elsewhere classified: Secondary | ICD-10-CM | POA: Diagnosis not present

## 2013-10-13 DIAGNOSIS — J189 Pneumonia, unspecified organism: Secondary | ICD-10-CM | POA: Diagnosis not present

## 2013-10-13 DIAGNOSIS — N390 Urinary tract infection, site not specified: Secondary | ICD-10-CM | POA: Diagnosis not present

## 2013-10-13 NOTE — Telephone Encounter (Signed)
Pt requesting refill Aricept 5mg  last refiled historical provider.

## 2013-10-14 MED ORDER — DONEPEZIL HCL 5 MG PO TABS: ORAL_TABLET | ORAL | Status: DC

## 2013-10-14 NOTE — Addendum Note (Signed)
Addended by: Peggyann Shoals on: 10/14/2013 01:26 PM   Modules accepted: Orders

## 2013-10-14 NOTE — Telephone Encounter (Signed)
Ok x 1 year 

## 2013-10-14 NOTE — Telephone Encounter (Signed)
rx sent

## 2013-10-15 DIAGNOSIS — J441 Chronic obstructive pulmonary disease with (acute) exacerbation: Secondary | ICD-10-CM | POA: Diagnosis not present

## 2013-10-15 DIAGNOSIS — M810 Age-related osteoporosis without current pathological fracture: Secondary | ICD-10-CM | POA: Diagnosis not present

## 2013-10-15 DIAGNOSIS — R262 Difficulty in walking, not elsewhere classified: Secondary | ICD-10-CM | POA: Diagnosis not present

## 2013-10-15 DIAGNOSIS — F0390 Unspecified dementia without behavioral disturbance: Secondary | ICD-10-CM | POA: Diagnosis not present

## 2013-10-15 DIAGNOSIS — N390 Urinary tract infection, site not specified: Secondary | ICD-10-CM | POA: Diagnosis not present

## 2013-10-15 DIAGNOSIS — J189 Pneumonia, unspecified organism: Secondary | ICD-10-CM | POA: Diagnosis not present

## 2013-10-16 ENCOUNTER — Telehealth: Payer: Self-pay

## 2013-10-16 ENCOUNTER — Other Ambulatory Visit: Payer: Self-pay

## 2013-10-16 DIAGNOSIS — J441 Chronic obstructive pulmonary disease with (acute) exacerbation: Secondary | ICD-10-CM | POA: Diagnosis not present

## 2013-10-16 DIAGNOSIS — R262 Difficulty in walking, not elsewhere classified: Secondary | ICD-10-CM | POA: Diagnosis not present

## 2013-10-16 DIAGNOSIS — M810 Age-related osteoporosis without current pathological fracture: Secondary | ICD-10-CM | POA: Diagnosis not present

## 2013-10-16 DIAGNOSIS — J189 Pneumonia, unspecified organism: Secondary | ICD-10-CM | POA: Diagnosis not present

## 2013-10-16 DIAGNOSIS — N390 Urinary tract infection, site not specified: Secondary | ICD-10-CM | POA: Diagnosis not present

## 2013-10-16 DIAGNOSIS — F0390 Unspecified dementia without behavioral disturbance: Secondary | ICD-10-CM | POA: Diagnosis not present

## 2013-10-16 NOTE — Telephone Encounter (Signed)
agree

## 2013-10-16 NOTE — Telephone Encounter (Signed)
Patient's son presents to the office with refusal from [facility] stating that we would not refill his mother's medication for aricept. Called pharmacy who states that the 5mg  will not be covered for twice daily. Refusal was from insurance. Changed Rx to aricept 10mg  1/2 tab twice a day. Pharmacy states that it can be split.

## 2013-10-19 ENCOUNTER — Telehealth: Payer: Self-pay | Admitting: *Deleted

## 2013-10-19 DIAGNOSIS — F0390 Unspecified dementia without behavioral disturbance: Secondary | ICD-10-CM | POA: Diagnosis not present

## 2013-10-19 DIAGNOSIS — J189 Pneumonia, unspecified organism: Secondary | ICD-10-CM | POA: Diagnosis not present

## 2013-10-19 DIAGNOSIS — N390 Urinary tract infection, site not specified: Secondary | ICD-10-CM | POA: Diagnosis not present

## 2013-10-19 DIAGNOSIS — J441 Chronic obstructive pulmonary disease with (acute) exacerbation: Secondary | ICD-10-CM | POA: Diagnosis not present

## 2013-10-19 DIAGNOSIS — R262 Difficulty in walking, not elsewhere classified: Secondary | ICD-10-CM | POA: Diagnosis not present

## 2013-10-19 DIAGNOSIS — M810 Age-related osteoporosis without current pathological fracture: Secondary | ICD-10-CM | POA: Diagnosis not present

## 2013-10-19 NOTE — Telephone Encounter (Signed)
Home health certification and plan of care received via fax from [facility]. Billing sheet attached, medications reviewed and placed in folder for Dr. Larose Kells. JG//CMA

## 2013-10-21 DIAGNOSIS — F0390 Unspecified dementia without behavioral disturbance: Secondary | ICD-10-CM | POA: Diagnosis not present

## 2013-10-21 DIAGNOSIS — J189 Pneumonia, unspecified organism: Secondary | ICD-10-CM | POA: Diagnosis not present

## 2013-10-21 DIAGNOSIS — M810 Age-related osteoporosis without current pathological fracture: Secondary | ICD-10-CM | POA: Diagnosis not present

## 2013-10-21 DIAGNOSIS — R262 Difficulty in walking, not elsewhere classified: Secondary | ICD-10-CM | POA: Diagnosis not present

## 2013-10-21 DIAGNOSIS — J441 Chronic obstructive pulmonary disease with (acute) exacerbation: Secondary | ICD-10-CM | POA: Diagnosis not present

## 2013-10-21 DIAGNOSIS — N390 Urinary tract infection, site not specified: Secondary | ICD-10-CM | POA: Diagnosis not present

## 2013-10-22 DIAGNOSIS — F0390 Unspecified dementia without behavioral disturbance: Secondary | ICD-10-CM | POA: Diagnosis not present

## 2013-10-22 DIAGNOSIS — J441 Chronic obstructive pulmonary disease with (acute) exacerbation: Secondary | ICD-10-CM

## 2013-10-22 DIAGNOSIS — J189 Pneumonia, unspecified organism: Secondary | ICD-10-CM | POA: Diagnosis not present

## 2013-10-22 DIAGNOSIS — M810 Age-related osteoporosis without current pathological fracture: Secondary | ICD-10-CM | POA: Diagnosis not present

## 2013-10-23 DIAGNOSIS — J441 Chronic obstructive pulmonary disease with (acute) exacerbation: Secondary | ICD-10-CM | POA: Diagnosis not present

## 2013-10-23 DIAGNOSIS — N390 Urinary tract infection, site not specified: Secondary | ICD-10-CM | POA: Diagnosis not present

## 2013-10-23 DIAGNOSIS — J189 Pneumonia, unspecified organism: Secondary | ICD-10-CM | POA: Diagnosis not present

## 2013-10-23 DIAGNOSIS — R262 Difficulty in walking, not elsewhere classified: Secondary | ICD-10-CM | POA: Diagnosis not present

## 2013-10-23 DIAGNOSIS — F0390 Unspecified dementia without behavioral disturbance: Secondary | ICD-10-CM | POA: Diagnosis not present

## 2013-10-23 DIAGNOSIS — M810 Age-related osteoporosis without current pathological fracture: Secondary | ICD-10-CM | POA: Diagnosis not present

## 2013-10-26 DIAGNOSIS — J189 Pneumonia, unspecified organism: Secondary | ICD-10-CM | POA: Diagnosis not present

## 2013-10-26 DIAGNOSIS — M810 Age-related osteoporosis without current pathological fracture: Secondary | ICD-10-CM | POA: Diagnosis not present

## 2013-10-26 DIAGNOSIS — F0390 Unspecified dementia without behavioral disturbance: Secondary | ICD-10-CM | POA: Diagnosis not present

## 2013-10-26 DIAGNOSIS — N390 Urinary tract infection, site not specified: Secondary | ICD-10-CM | POA: Diagnosis not present

## 2013-10-26 DIAGNOSIS — J441 Chronic obstructive pulmonary disease with (acute) exacerbation: Secondary | ICD-10-CM | POA: Diagnosis not present

## 2013-10-26 DIAGNOSIS — R262 Difficulty in walking, not elsewhere classified: Secondary | ICD-10-CM | POA: Diagnosis not present

## 2013-10-27 DIAGNOSIS — J441 Chronic obstructive pulmonary disease with (acute) exacerbation: Secondary | ICD-10-CM | POA: Diagnosis not present

## 2013-10-27 DIAGNOSIS — M810 Age-related osteoporosis without current pathological fracture: Secondary | ICD-10-CM | POA: Diagnosis not present

## 2013-10-27 DIAGNOSIS — N390 Urinary tract infection, site not specified: Secondary | ICD-10-CM | POA: Diagnosis not present

## 2013-10-27 DIAGNOSIS — R262 Difficulty in walking, not elsewhere classified: Secondary | ICD-10-CM | POA: Diagnosis not present

## 2013-10-27 DIAGNOSIS — F0390 Unspecified dementia without behavioral disturbance: Secondary | ICD-10-CM | POA: Diagnosis not present

## 2013-10-27 DIAGNOSIS — J189 Pneumonia, unspecified organism: Secondary | ICD-10-CM | POA: Diagnosis not present

## 2013-10-28 DIAGNOSIS — J441 Chronic obstructive pulmonary disease with (acute) exacerbation: Secondary | ICD-10-CM | POA: Diagnosis not present

## 2013-10-28 DIAGNOSIS — F0390 Unspecified dementia without behavioral disturbance: Secondary | ICD-10-CM | POA: Diagnosis not present

## 2013-10-28 DIAGNOSIS — M810 Age-related osteoporosis without current pathological fracture: Secondary | ICD-10-CM | POA: Diagnosis not present

## 2013-10-28 DIAGNOSIS — R262 Difficulty in walking, not elsewhere classified: Secondary | ICD-10-CM | POA: Diagnosis not present

## 2013-10-28 DIAGNOSIS — J189 Pneumonia, unspecified organism: Secondary | ICD-10-CM | POA: Diagnosis not present

## 2013-10-28 DIAGNOSIS — N390 Urinary tract infection, site not specified: Secondary | ICD-10-CM | POA: Diagnosis not present

## 2013-10-29 DIAGNOSIS — R262 Difficulty in walking, not elsewhere classified: Secondary | ICD-10-CM | POA: Diagnosis not present

## 2013-10-29 DIAGNOSIS — J189 Pneumonia, unspecified organism: Secondary | ICD-10-CM | POA: Diagnosis not present

## 2013-10-29 DIAGNOSIS — M810 Age-related osteoporosis without current pathological fracture: Secondary | ICD-10-CM | POA: Diagnosis not present

## 2013-10-29 DIAGNOSIS — N390 Urinary tract infection, site not specified: Secondary | ICD-10-CM | POA: Diagnosis not present

## 2013-10-29 DIAGNOSIS — F0390 Unspecified dementia without behavioral disturbance: Secondary | ICD-10-CM | POA: Diagnosis not present

## 2013-10-29 DIAGNOSIS — J441 Chronic obstructive pulmonary disease with (acute) exacerbation: Secondary | ICD-10-CM | POA: Diagnosis not present

## 2013-11-03 DIAGNOSIS — R262 Difficulty in walking, not elsewhere classified: Secondary | ICD-10-CM | POA: Diagnosis not present

## 2013-11-03 DIAGNOSIS — J441 Chronic obstructive pulmonary disease with (acute) exacerbation: Secondary | ICD-10-CM | POA: Diagnosis not present

## 2013-11-03 DIAGNOSIS — F0390 Unspecified dementia without behavioral disturbance: Secondary | ICD-10-CM | POA: Diagnosis not present

## 2013-11-03 DIAGNOSIS — J189 Pneumonia, unspecified organism: Secondary | ICD-10-CM | POA: Diagnosis not present

## 2013-11-03 DIAGNOSIS — N390 Urinary tract infection, site not specified: Secondary | ICD-10-CM | POA: Diagnosis not present

## 2013-11-03 DIAGNOSIS — M810 Age-related osteoporosis without current pathological fracture: Secondary | ICD-10-CM | POA: Diagnosis not present

## 2013-11-04 DIAGNOSIS — J441 Chronic obstructive pulmonary disease with (acute) exacerbation: Secondary | ICD-10-CM | POA: Diagnosis not present

## 2013-11-04 DIAGNOSIS — R262 Difficulty in walking, not elsewhere classified: Secondary | ICD-10-CM | POA: Diagnosis not present

## 2013-11-04 DIAGNOSIS — N390 Urinary tract infection, site not specified: Secondary | ICD-10-CM | POA: Diagnosis not present

## 2013-11-04 DIAGNOSIS — M810 Age-related osteoporosis without current pathological fracture: Secondary | ICD-10-CM | POA: Diagnosis not present

## 2013-11-04 DIAGNOSIS — F0390 Unspecified dementia without behavioral disturbance: Secondary | ICD-10-CM | POA: Diagnosis not present

## 2013-11-04 DIAGNOSIS — J189 Pneumonia, unspecified organism: Secondary | ICD-10-CM | POA: Diagnosis not present

## 2013-11-06 DIAGNOSIS — J441 Chronic obstructive pulmonary disease with (acute) exacerbation: Secondary | ICD-10-CM | POA: Diagnosis not present

## 2013-11-06 DIAGNOSIS — N390 Urinary tract infection, site not specified: Secondary | ICD-10-CM | POA: Diagnosis not present

## 2013-11-06 DIAGNOSIS — R262 Difficulty in walking, not elsewhere classified: Secondary | ICD-10-CM | POA: Diagnosis not present

## 2013-11-06 DIAGNOSIS — J189 Pneumonia, unspecified organism: Secondary | ICD-10-CM | POA: Diagnosis not present

## 2013-11-06 DIAGNOSIS — M810 Age-related osteoporosis without current pathological fracture: Secondary | ICD-10-CM | POA: Diagnosis not present

## 2013-11-06 DIAGNOSIS — F0390 Unspecified dementia without behavioral disturbance: Secondary | ICD-10-CM | POA: Diagnosis not present

## 2013-11-08 ENCOUNTER — Telehealth: Payer: Self-pay | Admitting: Internal Medicine

## 2013-11-08 DIAGNOSIS — J189 Pneumonia, unspecified organism: Secondary | ICD-10-CM

## 2013-11-08 NOTE — Telephone Encounter (Signed)
Advise patient's sonJohn : [phone]  Patient is due for a chest x-ray, (DX followup pneumonia). Please arrange

## 2013-11-10 ENCOUNTER — Telehealth: Payer: Self-pay | Admitting: *Deleted

## 2013-11-10 NOTE — Telephone Encounter (Signed)
Caller name:  [name] Relation to pt:  son Call back number:  [phone] Pharmacy:  Reason for call:   Pt son is wanting to speak to someone regarding her medication ibandronate (BONIVA) 150 MG tablet.  Son says pt seems to have 2 of the side effects and wants to discuss before giving her another dose.  Side effects she is having are jaw pain more than normal and unusual pain in thigh more than usual.   This son is not listed on pt DPR dated 04/15/2012.  Please advise.  bw

## 2013-11-11 ENCOUNTER — Encounter: Payer: Self-pay | Admitting: Internal Medicine

## 2013-11-11 ENCOUNTER — Ambulatory Visit (HOSPITAL_BASED_OUTPATIENT_CLINIC_OR_DEPARTMENT_OTHER)
Admission: RE | Admit: 2013-11-11 | Discharge: 2013-11-11 | Disposition: A | Discharge status: Home / Self Care | Payer: Medicare Other | Source: Ambulatory Visit | Attending: Internal Medicine | Admitting: Internal Medicine

## 2013-11-11 ENCOUNTER — Ambulatory Visit (INDEPENDENT_AMBULATORY_CARE_PROVIDER_SITE_OTHER): Payer: Medicare Other | Admitting: Internal Medicine

## 2013-11-11 VITALS — BP 126/74 | HR 68 | Temp 98.2°F | Wt 116.0 lb

## 2013-11-11 DIAGNOSIS — M1990 Unspecified osteoarthritis, unspecified site: Secondary | ICD-10-CM | POA: Diagnosis not present

## 2013-11-11 DIAGNOSIS — M899 Disorder of bone, unspecified: Secondary | ICD-10-CM | POA: Insufficient documentation

## 2013-11-11 DIAGNOSIS — M810 Age-related osteoporosis without current pathological fracture: Secondary | ICD-10-CM | POA: Diagnosis not present

## 2013-11-11 DIAGNOSIS — M949 Disorder of cartilage, unspecified: Secondary | ICD-10-CM

## 2013-11-11 DIAGNOSIS — M542 Cervicalgia: Secondary | ICD-10-CM

## 2013-11-11 DIAGNOSIS — S8990XA Unspecified injury of unspecified lower leg, initial encounter: Secondary | ICD-10-CM | POA: Diagnosis not present

## 2013-11-11 DIAGNOSIS — M25569 Pain in unspecified knee: Secondary | ICD-10-CM | POA: Diagnosis not present

## 2013-11-11 DIAGNOSIS — S7290XD Unspecified fracture of unspecified femur, subsequent encounter for closed fracture with routine healing: Secondary | ICD-10-CM | POA: Diagnosis not present

## 2013-11-11 DIAGNOSIS — S99919A Unspecified injury of unspecified ankle, initial encounter: Secondary | ICD-10-CM | POA: Diagnosis not present

## 2013-11-11 MED ORDER — PREDNISONE 10 MG PO TABS
10.0000 mg | ORAL_TABLET | Freq: Every day | ORAL | Status: DC
Start: 2013-11-11 — End: 2014-08-04

## 2013-11-11 NOTE — Assessment & Plan Note (Addendum)
Pt son has a number of questions about boniva, Told me he was surprised about the patient going  back on biphosphonates after her leg fracture. Benefits, risk and alternatives to biphosphonates discussed at length; reasons behind re-start boniva after a fracture also discussed. Prolia ? [name] stated he was not satisfied w/ my explanation and thought my beside maners were poor thus the best option is to seek another opinion. Plan-- refer to endocrinology for further advise, d/c boniva for now

## 2013-11-11 NOTE — Progress Notes (Signed)
Subjective:    Patient ID: Donna Flowers, female    DOB: 20-Apr-1928, 80 y.o.   MRN: [id number]  DOS:  11/11/2013 Type of  Visit: acute, here w/ her son [name]  History: Several days history of right-sided posterior neck pain, it decreased with Tylenol but it is persistent and sometimes intense. Denies any recent fall or injury. Also,pt thinks she fell 6 months ago, since then her R knee is hurting, no locking phenomena. Also, her son has a number of questions about osteoporosis treatment, see assessment and plan    ROS Denies fever or chills No sinus pain or congestion No  ear ache. No headaches per se. No cervical spine pain  Past Medical History  Diagnosis Date  . COPD (chronic obstructive pulmonary disease)   . Osteoporosis   . Dementia   . Complication of anesthesia     lot of confusion    Past Surgical History  Procedure Laterality Date  . Joint replacement      right hip, left knee  . Ankle surgery      right    History   Social History  . Marital Status: Widowed    Spouse Name: N/A    Number of Children: 2  . Years of Education: N/A   Occupational History  . elderly    Social History Main Topics  . Smoking status: Never Smoker   . Smokeless tobacco: Never Used  . Alcohol Use: No  . Drug Use: No  . Sexual Activity: Not on file   Other Topics Concern  . Not on file   Social History Narrative   Lives by herself.   Since admission 09-2013, has a sitter 8-5 pm.   [name] (son) [phone]                 Medication List       This list is accurate as of: 11/11/13 11:59 PM.  Always use your most recent med list.               acetaminophen 325 MG tablet  Commonly known as:  TYLENOL  Take 2 tablets (650 mg total) by mouth every 6 (six) hours as needed for mild pain (or Fever >/= 101).     albuterol 108 (90 BASE) MCG/ACT inhaler  Commonly known as:  PROVENTIL HFA;VENTOLIN HFA  Inhale 2 puffs into the lungs every 6 (six) hours as needed  for wheezing or shortness of breath.     budesonide-formoterol 80-4.5 MCG/ACT inhaler  Commonly known as:  SYMBICORT  Inhale 2 puffs into the lungs 2 (two) times daily.     calcium-vitamin D 500-200 MG-UNIT per tablet  Commonly known as:  OSCAL WITH D  Take 1 tablet by mouth daily.     donepezil 10 MG tablet  Commonly known as:  ARICEPT  Take 10 mg by mouth daily. Take 1/2 tab twice daily     ibandronate 150 MG tablet  Commonly known as:  BONIVA  Take 1 tablet (150 mg total) by mouth every 30 (thirty) days. Take in the morning with a full glass of water, on an empty stomach, and do not take anything else by mouth or lie down for the next 30 min.     multivitamin with minerals tablet  Take 1 tablet by mouth daily.     predniSONE 10 MG tablet  Commonly known as:  DELTASONE  Take 1 tablet (10 mg total) by mouth daily. 2 tabs a  day x 5 days           Objective:   Physical Exam BP 126/74  Pulse 68  Temp(Src) 98.2 F (36.8 C)  Wt 116 lb (52.617 kg)  SpO2 99% General -- alert, well-developed, NAD.  Neck --no TTP at Cspine, FROM, no TTP at mastoid areas ; slt tender at the insertion of the R  SCM to the skull HEENT-- Not pale. TMs normal  Extremities-- no pretibial edema bilaterally ; R knee w/ deformities c/w DJD, no effusion, no redness , ROM normal Neurologic--  alert  Speech normal, gait appropriate for age, strength symmetric and appropriate for age.  Psych--   No anxious or depressed appearing.      Assessment & Plan:   Neck pain. Pain seems to be located at the insertion of the SCM to the skull Plan: Low dose of prednisone Tylenol, ice. If no better, will refer to orthopedic surgery (has seen Dr Cay Schillings before) , may benefit from a local ejection.  Today , I spent more than 35 min with the patient and her son

## 2013-11-11 NOTE — Assessment & Plan Note (Signed)
Knee pain likely DJD, recommend x-ray and Tylenol as needed.

## 2013-11-11 NOTE — Patient Instructions (Signed)
Knee pain:  Get the XR at [facility], corner of [address] (10 minutes form here); they are open 24/7 [address]  [place], [place] [address] [phone]  Tylenol  500 mg OTC 2 tabs a day every 8 hours as needed for pain   Neck pain: Prednisone as prescribed Also apply ice twice a day Call if no better in 10 days or if symptoms severe  Osteoporosis: Stop boniva Will refer you  to a especialist

## 2013-11-11 NOTE — Telephone Encounter (Signed)
Acute visit 11/11/13 scheduled

## 2013-11-11 NOTE — Telephone Encounter (Signed)
Acute visit 11/11/13 . Xray already ordered.

## 2013-11-11 NOTE — Progress Notes (Signed)
Pre visit review using our clinic review tool, if applicable. No additional management support is needed unless otherwise documented below in the visit note. 

## 2013-11-12 DIAGNOSIS — M2040 Other hammer toe(s) (acquired), unspecified foot: Secondary | ICD-10-CM | POA: Diagnosis not present

## 2013-11-12 DIAGNOSIS — L608 Other nail disorders: Secondary | ICD-10-CM | POA: Diagnosis not present

## 2013-11-16 ENCOUNTER — Telehealth: Payer: Self-pay | Admitting: *Deleted

## 2013-11-16 NOTE — Telephone Encounter (Signed)
Received fax from [facility] for a medical exam report for patient to attend a day program. Form filled out as much as possible and forwarded to Dr. Larose Kells for review/signature. JG//CMA

## 2013-11-18 NOTE — Telephone Encounter (Signed)
[facility] at [phone]. JG//CMA

## 2013-11-19 ENCOUNTER — Ambulatory Visit (INDEPENDENT_AMBULATORY_CARE_PROVIDER_SITE_OTHER): Payer: Medicare Other | Admitting: Internal Medicine

## 2013-11-19 ENCOUNTER — Encounter: Payer: Self-pay | Admitting: Internal Medicine

## 2013-11-19 VITALS — BP 124/68 | HR 74 | Temp 98.0°F | Resp 12 | Ht 63.0 in | Wt 117.0 lb

## 2013-11-19 DIAGNOSIS — M810 Age-related osteoporosis without current pathological fracture: Secondary | ICD-10-CM | POA: Diagnosis not present

## 2013-11-19 LAB — BASIC METABOLIC PANEL
BUN: 12 mg/dL (ref 6–23)
CHLORIDE: 103 meq/L (ref 96–112)
CO2: 30 mEq/L (ref 19–32)
Calcium: 9.5 mg/dL (ref 8.4–10.5)
Creatinine, Ser: 0.8 mg/dL (ref 0.4–1.2)
GFR: 71.67 mL/min (ref >60.00)
Glucose, Bld: 73 mg/dL (ref 70–99)
POTASSIUM: 4.3 meq/L (ref 3.5–5.1)
Sodium: 139 mEq/L (ref 135–145)

## 2013-11-19 LAB — PHOSPHORUS: Phosphorus: 3.4 mg/dL (ref 2.3–4.6)

## 2013-11-19 LAB — VITAMIN D 25 HYDROXY (VIT D DEFICIENCY, FRACTURES): VITD: 25.88 ng/mL

## 2013-11-19 LAB — MAGNESIUM: MAGNESIUM: 2 mg/dL (ref 1.5–2.5)

## 2013-11-19 NOTE — Progress Notes (Signed)
Patient ID: Donna Flowers, female   DOB: 1928/06/04, 80 y.o.   MRN: [id number]   HPI  Donna Flowers is a 80 y.o.-year-old female, referred by her PCP, Dr. Larose Kells, for evaluation for osteoporosis.  Pt was dx with OP in 2005.   Previous fractures: - ~1995 bilateral wrist fxs - ~2000 open fx L shin - 2011 R femoral fx (spiral fx) - apparently for FN necrosis  Joints replaced: - 1980s and repaired 2005: R hip - ~ 1997 L knee   Treatments: - Fosamax x 1 year  - Forteo 2008-2010 (total 2 years) - Boniva monthly - started 10/09/2013 She developed R thigh (femur + knee) and R jaw - after starting PT and Boniva. Xray negative for fx. The R jaw pain is near the TMJ.  She had PNA >> now on PT at home.   I reviewed pt's DEXA scans: Date L1-L4 T score FN T score 33% distal Radius  10/20/2007 uninterpretable R: prosthetic L: -2.8 n/a  Previous DEXA scans not available for review.  No h/o hyper/hypocalcemia. No h/o hyperparathyroidism. No h/o kidney stones. Lab Results  Component Value Date   CALCIUM 8.1* 09/24/2013   CALCIUM 8.1* 09/23/2013   CALCIUM 9.1 09/22/2013   CALCIUM 9.5 03/04/2013   CALCIUM 9.2 02/03/2010   CALCIUM 8.8 02/02/2010   CALCIUM 8.7 02/01/2010   CALCIUM 8.3* 06/24/2009   CALCIUM 8.4 06/22/2009   CALCIUM 8.0* 06/21/2009   No h/o thyrotoxicosis. Reviewed TSH recent levels:  Lab Results  Component Value Date   TSH 1.20 03/04/2013   TSH 0.78 03/28/2007   No h/o vitamin D deficiency.  No h/o CKD. Last BUN/Cr: Lab Results  Component Value Date   BUN 10 09/24/2013   CREATININE 0.68 09/24/2013   Pt is on calcium and vitamin D (1 MVI) in am and 500-200 1 tablet in pm. She also eats dairy and green, leafy, vegetables. No weight bearing exercises, but exercises regularly. Also, PT. She does not take high vitamin A doses.  ? FH of osteoporosis.  She also has a history of dementia.   ROS: Constitutional: no weight gain/loss, + fatigue, no subjective  hyperthermia/hypothermia Eyes: no blurry vision, no xerophthalmia ENT: no sore throat, no nodules palpated in throat, no dysphagia/odynophagia, no hoarseness Cardiovascular: no CP/SOB/palpitations/leg swelling Respiratory: no cough/SOB Gastrointestinal: no N/V/D/C Musculoskeletal:+ muscle/+ joint aches Skin: no rashes Neurological: no tremors/numbness/tingling/dizziness, + HA Psychiatric: no depression/anxiety  Past Medical History  Diagnosis Date  . COPD (chronic obstructive pulmonary disease)   . Osteoporosis   . Dementia   . Complication of anesthesia     lot of confusion   Past Surgical History  Procedure Laterality Date  . Joint replacement      right hip, left knee  . Ankle surgery      right   History   Social History  . Marital Status: Widowed    Spouse Name: N/A    Number of Children: 2  . Years of Education: N/A   Occupational History  . elderly    Social History Main Topics  . Smoking status: Never Smoker   . Smokeless tobacco: Never Used  . Alcohol Use: No  . Drug Use: No  . Sexual Activity: Not on file   Other Topics Concern  . Not on file   Social History Narrative   Lives by herself.   Since admission 09-2013, has a sitter 8-5 pm.   [name] (son) [phone]  Current Outpatient Prescriptions on File Prior to Visit  Medication Sig Dispense Refill  . acetaminophen (TYLENOL) 325 MG tablet Take 2 tablets (650 mg total) by mouth every 6 (six) hours as needed for mild pain (or Fever >/= 101).      Marland Kitchen albuterol (PROVENTIL HFA;VENTOLIN HFA) 108 (90 BASE) MCG/ACT inhaler Inhale 2 puffs into the lungs every 6 (six) hours as needed for wheezing or shortness of breath.  1 Inhaler  2  . budesonide-formoterol (SYMBICORT) 80-4.5 MCG/ACT inhaler Inhale 2 puffs into the lungs 2 (two) times daily.  1 Inhaler  12  . calcium-vitamin D (OSCAL WITH D) 500-200 MG-UNIT per tablet Take 1 tablet by mouth daily.      Marland Kitchen donepezil (ARICEPT) 10 MG tablet Take 10 mg  by mouth daily. Take 1/2 tab twice daily      . ibandronate (BONIVA) 150 MG tablet Take 1 tablet (150 mg total) by mouth every 30 (thirty) days. Take in the morning with a full glass of water, on an empty stomach, and do not take anything else by mouth or lie down for the next 30 min.  3 tablet  1  . Multiple Vitamins-Minerals (MULTIVITAMIN WITH MINERALS) tablet Take 1 tablet by mouth daily.      . predniSONE (DELTASONE) 10 MG tablet Take 1 tablet (10 mg total) by mouth daily. 2 tabs a day x 5 days  10 tablet  0   No current facility-administered medications on file prior to visit.   No Known Allergies History reviewed. No pertinent family history.  PE: BP 124/68  Pulse 74  Temp(Src) 98 F (36.7 C) (Oral)  Resp 12  Ht 5\' 3"  (1.6 m)  Wt 117 lb (53.071 kg)  BMI 20.73 kg/m2  SpO2 96% Wt Readings from Last 3 Encounters:  11/19/13 117 lb (53.071 kg)  11/11/13 116 lb (52.617 kg)  10/05/13 121 lb (54.885 kg)   Constitutional: normal weight, in NAD, frail Eyes: PERRLA, EOMI, no exophthalmos ENT: moist mucous membranes, no thyromegaly, no cervical lymphadenopathy Cardiovascular: RRR, No MRG Respiratory: CTA B Gastrointestinal: abdomen soft, NT, ND, BS+ Musculoskeletal: no deformities, strength intact in all 4 Skin: moist, warm, no rashes Neurological: no tremor with outstretched hands, DTR normal in all 4  Assessment: 1. Osteoporosis  Plan: 1. Osteoporosis - likely postmenopausal  - Discussed about increased risk of fracture, depending on the T score, greatly increased when the T score is lower than -2.5, but it is actually a continuum and -2.5 should not be regarded as an absolute threshold. The only T score that I could review was -2.8 at the level of the L hip (2009). We will ask for the rest of the DEXA report from [facility]. - We reviewed her DEXA scans together, and I explained that based on the T scores, she has an increased risk for fractures.  - We  discussed about the different medication classes, benefits and side effects (including atypical fractures and ONJ - no dental workup in progress or planned). She already had 2 years of Teriparatide >> we are left with Bisphosphonates and Prolia. Since she is high risk for fractures after she had so many in the past >> I suggested Prolia. Given info about this.  - I advised her that the R thigh does not signs of a fracture, so pain management would be the only thing indicated. For the jaw, I would like her to see her dentist to make sure no ONJ. Until then, stop Boniva. -  we reviewed her dietary and supplemental calcium and vitamin D intake. I suggested to get at least 1000 units vitamin D at least 1000 mg of calcium daily. IGiven her specific instructions about food sources for these - see pt instructions  - discussed fall precautions  - We will check the following tests:  Vitamin D  BMP (she had several instances of low Calcium >> will recheck as low Ca/low vit D can cause bone pain)  Phosphorus  Magnesium - we may need a new DEXA scan  - will see pt back in 6 mo  - time spent with the patient: 1 hour, of which >50% was spent in obtaining information about her symptoms, reviewing her previous labs, evaluations, and treatments, counseling her about her condition (please see the discussed topics above), and developing a plan to further investigate and treat it.  Son prefers lab results sent through email:  [email or web address]  Component     Latest Ref Rng 11/19/2013  Sodium     135 - 145 mEq/L 139  Potassium     3.5 - 5.1 mEq/L 4.3  Chloride     96 - 112 mEq/L 103  CO2     19 - 32 mEq/L 30  Glucose     70 - 99 mg/dL 73  BUN     6 - 23 mg/dL 12  Creatinine     0.4 - 1.2 mg/dL 0.8  Calcium     8.4 - 10.5 mg/dL 9.5  GFR     >60.00 mL/min 71.67  VITD      25.88  Magnesium     1.5 - 2.5 mg/dL 2.0  Phosphorus     2.3 - 4.6 mg/dL 3.4   Low vitamin D Start a supplement  of 2000 IU vitamin D daily. Will need a recheck vit D level in 2 months.   Email sent: Dear Mr Rumbaugh, Here are your mother's results - they are all normal except for the vitamin D which is low, at 25.8 (normal >30). Let's start 2000 units of over-the -counter vitamin D daily and recheck in 2 months - I will order the test, she can just come to the lab. Hold Boniva until then. Please let me know about the dentist opinion about the jaw pain. Component Latest Ref Rng 11/19/2013  Sodium 135 - 145 mEq/L 139  Potassium 3.5 - 5.1 mEq/L 4.3  Chloride 96 - 112 mEq/L 103  CO2 19 - 32 mEq/L 30  Glucose 70 - 99 mg/dL 73  BUN 6 - 23 mg/dL 12  Creatinine 0.4 - 1.2 mg/dL 0.8  Calcium 8.4 - 10.5 mg/dL 9.5  GFR >60.00 mL/min 71.67  VITD 25.88  Magnesium 1.5 - 2.5 mg/dL 2.0  Phosphorus 2.3 - 4.6 mg/dL 3.4  Sincerely, Philemon Kingdom MD

## 2013-11-19 NOTE — Patient Instructions (Signed)
Please stop at the lab. We will decide for further treatment after we have the results back. Please check with your dentist regarding the jaw pain. Please tell him you took 1 dose of Boniva before pain started. We can hold off Boniva until we clarify the jaw problem. Please return in 6 months.  How Can I Prevent Falls? Men and women with osteoporosis need to take care not to fall down. Falls can break bones. Some reasons people fall are: Poor vision  Poor balance  Certain diseases that affect how you walk  Some types of medicine, such as sleeping pills.  Some tips to help prevent falls outdoors are: Use a cane or walker  Wear rubber-soled shoes so you don't slip  Walk on grass when sidewalks are slippery  In winter, put salt or kitty litter on icy sidewalks.  Some ways to help prevent falls indoors are: Keep rooms free of clutter, especially on floors  Use plastic or carpet runners on slippery floors  Wear low-heeled shoes that provide good support  Do not walk in socks, stockings, or slippers  Be sure carpets and area rugs have skid-proof backs or are tacked to the floor  Be sure stairs are well lit and have rails on both sides  Put grab bars on bathroom walls near tub, shower, and toilet  Use a rubber bath mat in the shower or tub  Keep a flashlight next to your bed  Use a sturdy step stool with a handrail and wide steps  Add more lights in rooms (and night lights) Buy a cordless phone to keep with you so that you don't have to rush to the phone       when it rings and so that you can call for help if you fall.   (adapted from http://www.niams.NightlifePreviews.se)  Dietary sources of calcium and vitamin D:  Calcium content (mg) - http://www.niams.MoviePins.co.za  Fortified oatmeal, 1 packet 350  Sardines, canned in oil, with edible bones, 3 oz. 324  Cheddar cheese, 1 oz. shredded 306  Milk, nonfat, 1 cup 302   Milkshake, 1 cup 300  Yogurt, plain, low-fat, 1 cup 300  Soybeans, cooked, 1 cup 261  Tofu, firm, with calcium,  cup 204  Orange juice, fortified with calcium, 6 oz. 200-260 (varies)  Salmon, canned, with edible bones, 3 oz. 181  Pudding, instant, made with 2% milk,  cup 153  Baked beans, 1 cup [place], 1% milk fat, 1 cup 138  Spaghetti, lasagna, 1 cup 125  Frozen yogurt, vanilla, soft-serve,  cup 103  Ready-to-eat cereal, fortified with calcium, 1 cup 100-1,000 (varies)  Cheese pizza, 1 slice 585  Fortified waffles, 2 100  Turnip greens, boiled,  cup 99  Broccoli, raw, 1 cup 90  Ice cream, vanilla,  cup 85  Soy or rice milk, fortified with calcium, 1 cup 80-500 (varies)   Vitamin D content (International Units, IU) - https://www.ars.usda.gov Cod liver oil, 1 tablespoon 1,360  Swordfish, cooked, 3 oz 566  Salmon (sockeye), cooked, 3 oz 447  Tuna fish, canned in water, drained, 3 oz 154  Orange juice fortified with vitamin D, 1 cup (check product labels, as amount of added vitamin D varies) 137  Milk, nonfat, reduced fat, and whole, vitamin D-fortified, 1 cup 115-124  Yogurt, fortified with 20% of the daily value for vitamin D, 6 oz 80  Margarine, fortified, 1 tablespoon 60  Sardines, canned in oil, drained, 2 sardines 46  Liver, beef, cooked, 3  oz 42  Egg, 1 large (vitamin D is found in yolk) 41  Ready-to-eat cereal, fortified with 10% of the daily value for vitamin D, 0.75-1 cup  40  Cheese, Swiss, 1 oz 6   Here is information about Prolia: Denosumab: Patient drug information (Up-to-date) Copyright 8647189280 [place] rights reserved.  Brand Names: [place]  ProliaDelton See What is this drug used for?  It is used to treat soft, brittle bones (osteoporosis).  It is used for bone growth.  It is used when treating some cancers.  It may be given to you for other reasons. Talk with the doctor. What do I need to tell my doctor BEFORE I take this  drug?  All products:  If you have an allergy to denosumab or any other part of this drug.  If you are allergic to any drugs like this one, any other drugs, foods, or other substances. Tell your doctor about the allergy and what signs you had, like rash; hives; itching; shortness of breath; wheezing; cough; swelling of face, lips, tongue, or throat; or any other signs.  If you have low calcium levels.  Prolia:  If you are pregnant or may be pregnant. Do not take this drug if you are pregnant.  This is not a list of all drugs or health problems that interact with this drug.  Tell your doctor and pharmacist about all of your drugs (prescription or OTC, natural products, vitamins) and health problems. You must check to make sure that it is safe for you to take this drug with all of your drugs and health problems. Do not start, stop, or change the dose of any drug without checking with your doctor. What are some things I need to know or do while I take this drug?  All products:  Tell dentists, surgeons, and other doctors that you use this drug.  This drug may raise the chance of a broken leg. Talk with your doctor.  Have your blood work checked. Talk with your doctor.  Have a bone density test. Talk with your doctor.  Take calcium and vitamin D as you were told by your doctor.  Have a dental exam before starting this drug.  Take good care of your teeth. See a dentist often.  If you smoke, talk with your doctor.  Do not give to a child. Talk with your doctor.  Tell your doctor if you are breast-feeding. You will need to talk about any risks to your baby.  Delton See:  This drug may cause harm to the unborn baby if you take it while you are pregnant. If you get pregnant while taking this drug, call your doctor right away.  Prolia:  Very bad infections have been reported with use of this drug. If you have any infection, are taking antibiotics now or in the recent past, or have many  infections, talk with your doctor.  You may have more chance of getting an infection. Wash hands often. Stay away from people with infections, colds, or flu.  Use birth control that you can trust to prevent pregnancy while taking this drug.  If you are a man and your sex partner is pregnant or gets pregnant at any time while you are being treated, talk with your doctor. What are some side effects that I need to call my doctor about right away?  WARNING/CAUTION: Even though it may be rare, some people may have very bad and sometimes deadly side effects when taking a  drug. Tell your doctor or get medical help right away if you have any of the following signs or symptoms that may be related to a very bad side effect:  All products:  Signs of an allergic reaction, like rash; hives; itching; red, swollen, blistered, or peeling skin with or without fever; wheezing; tightness in the chest or throat; trouble breathing or talking; unusual hoarseness; or swelling of the mouth, face, lips, tongue, or throat.  Signs of low calcium levels like muscle cramps or spasms, numbness and tingling, or seizures.  Mouth sores.  Any new or strange groin, hip, or thigh pain.  This drug may cause jawbone problems. The chance may be higher the longer you take this drug. The chance may be higher if you have cancer, dental problems, dentures that do not fit well, anemia, blood clotting problems, or an infection. The chance may also be higher if you are having dental work or if you are getting chemo, some steroid drugs, or radiation. Call your doctor right away if you have jaw swelling or pain.  Xgeva:  Not hungry.  Muscle pain or weakness.  Seizures.  Shortness of breath.  Prolia:  Signs of infection. These include a fever of 100.63F (38C) or higher, chills, very bad sore throat, ear or sinus pain, cough, more sputum or change in color of sputum, pain with passing urine, mouth sores, wound that will not heal,  or anal itching or pain.  Signs of a pancreas problem (pancreatitis) like very bad stomach pain, very bad back pain, or very bad upset stomach or throwing up.  Chest pain.  A heartbeat that does not feel normal.  Very bad skin irritation.  Feeling very tired or weak.  Bladder pain or pain when passing urine or change in how much urine is passed.  Passing urine often.  Swelling in the arms or legs. What are some other side effects of this drug?  All drugs may cause side effects. However, many people have no side effects or only have minor side effects. Call your doctor or get medical help if any of these side effects or any other side effects bother you or do not go away:  Xgeva:  Feeling tired or weak.  Headache.  Upset stomach or throwing up.  Loose stools (diarrhea).  Cough.  Prolia:  Back pain.  Muscle or joint pain.  Sore throat.  Runny nose.  Pain in arms or legs.  These are not all of the side effects that may occur. If you have questions about side effects, call your doctor. Call your doctor for medical advice about side effects.  You may report side effects to your national health agency. How is this drug best taken?  Use this drug as ordered by your doctor. Read and follow the dosing on the label closely.  It is given as a shot into the fatty part of the skin. What do I do if I miss a dose?  Call the doctor to find out what to do. How do I store and/or throw out this drug?  This drug will be given to you in a hospital or doctor's office. You will not store it at home.  Keep all drugs out of the reach of children and pets.  Check with your pharmacist about how to throw out unused drugs.  General drug facts  If your symptoms or health problems do not get better or if they become worse, call your doctor.  Do not share your drugs  with others and do not take anyone else's drugs.  Keep a list of all your drugs (prescription, natural products,  vitamins, OTC) with you. Give this list to your doctor.  Talk with the doctor before starting any new drug, including prescription or OTC, natural products, or vitamins.  Some drugs may have another patient information leaflet. If you have any questions about this drug, please talk with your doctor, pharmacist, or other health care provider.  If you think there has been an overdose, call your poison control center or get medical care right away. Be ready to tell or show what was taken, how much, and when it happened.

## 2013-11-23 NOTE — Telephone Encounter (Signed)
Error

## 2013-11-24 ENCOUNTER — Telehealth: Payer: Self-pay | Admitting: *Deleted

## 2013-11-24 NOTE — Telephone Encounter (Signed)
Corrections made and faxed back to [place]. JG//CMA

## 2013-11-24 NOTE — Telephone Encounter (Signed)
Donna Flowers with [facility] called and stated they need corrected Certificate of medical Necessity, CMS-484 -- Oxygen. She is faxing today 11/24/2013.  Corrections that need to be made are as follows:  Section B Question 1a - draw line through the number 55.  1b - change 94 to 87%.  1c - change 09/22/2013 to 09/28/2013.       Question 2 - circle the number 2, X over the number 1.  Initial and date beside changes and fax back to [place] at [phone].

## 2014-07-21 ENCOUNTER — Ambulatory Visit (INDEPENDENT_AMBULATORY_CARE_PROVIDER_SITE_OTHER): Payer: Medicare Other | Admitting: Medical

## 2014-07-21 ENCOUNTER — Encounter: Payer: Self-pay | Admitting: Medical

## 2014-07-21 VITALS — BP 133/65 | HR 73 | Temp 98.3°F | Ht 63.0 in | Wt 117.0 lb

## 2014-07-21 DIAGNOSIS — N3001 Acute cystitis with hematuria: Secondary | ICD-10-CM | POA: Diagnosis not present

## 2014-07-21 DIAGNOSIS — R42 Dizziness and giddiness: Secondary | ICD-10-CM | POA: Diagnosis not present

## 2014-07-21 DIAGNOSIS — R350 Frequency of micturition: Secondary | ICD-10-CM

## 2014-07-21 DIAGNOSIS — R82998 Other abnormal findings in urine: Secondary | ICD-10-CM

## 2014-07-21 DIAGNOSIS — N390 Urinary tract infection, site not specified: Secondary | ICD-10-CM | POA: Diagnosis not present

## 2014-07-21 LAB — POCT URINALYSIS DIPSTICK
GLUCOSE UA: NEGATIVE
KETONES UA: NEGATIVE
NITRITE UA: POSITIVE
Protein, UA: 30
Spec Grav, UA: 1.01
Urobilinogen, UA: 0.2
pH, UA: 7.5

## 2014-07-21 MED ORDER — CIPROFLOXACIN HCL 500 MG PO TABS: 500.0000 mg | ORAL_TABLET | Freq: Two times a day (BID) | ORAL | Status: DC

## 2014-07-21 MED ORDER — PHENAZOPYRIDINE HCL 100 MG PO TABS: 100.0000 mg | ORAL_TABLET | Freq: Three times a day (TID) | ORAL | Status: DC | PRN

## 2014-07-21 NOTE — Progress Notes (Signed)
Pre visit review using our clinic review tool, if applicable. No additional management support is needed unless otherwise documented below in the visit note. 

## 2014-07-21 NOTE — Assessment & Plan Note (Signed)
Not present on exam, negative neuruologic exam. Described as mild, transient and benign.  However, if severe and constant or with neurologic signs/ symptoms then ED evaluation.  For positional dizziness please be careful and get balance before ambulating.

## 2014-07-21 NOTE — Progress Notes (Signed)
Subjective:    Patient ID: Donna Flowers, female    DOB: 06/05/1928, 81 y.o.   MRN: [id number]  Urinary Frequency  This is a new problem. The current episode started 1 to 4 weeks ago (More like 2 weeks.). The problem occurs intermittently (4-6 times a day.). The problem has been gradually worsening. The quality of the pain is described as burning. The pain is mild. There has been no fever. She is not sexually active. There is no history of pyelonephritis. Associated symptoms include frequency and urgency. Pertinent negatives include no chills, flank pain, hesitancy, nausea, sweats or vomiting. She has tried nothing for the symptoms.     Slight occasional light headed. Occasional on position change. Not dizzy right now. Pt felt slight dizzy this am for 1-2 minutes.    Review of Systems  Constitutional: Negative for fever, chills, diaphoresis, activity change and fatigue.  Respiratory: Negative for cough, chest tightness and shortness of breath.   Cardiovascular: Negative for chest pain, palpitations and leg swelling.  Gastrointestinal: Negative for nausea, vomiting and abdominal pain.  Genitourinary: Positive for dysuria, urgency and frequency. Negative for hesitancy, flank pain and difficulty urinating.  Musculoskeletal: Negative for back pain, neck pain and neck stiffness.  Neurological: Negative for dizziness, tremors, seizures, syncope, facial asymmetry, speech difficulty, weakness, light-headedness, numbness and headaches.  Psychiatric/Behavioral: Negative for behavioral problems, confusion and agitation. The patient is not nervous/anxious.    Past Medical History  Diagnosis Date  . COPD (chronic obstructive pulmonary disease)   . Osteoporosis   . Dementia   . Complication of anesthesia     lot of confusion    History   Social History  . Marital Status: Widowed    Spouse Name: N/A  . Number of Children: 2  . Years of Education: N/A   Occupational History  . elderly      Social History Main Topics  . Smoking status: Never Smoker   . Smokeless tobacco: Never Used  . Alcohol Use: No  . Drug Use: No  . Sexual Activity: Not on file   Other Topics Concern  . Not on file   Social History Narrative   Lives by herself.   Since admission 09-2013, has a sitter 8-5 pm.   [name] (son) [phone]             Past Surgical History  Procedure Laterality Date  . Joint replacement      right hip, left knee  . Ankle surgery      right    No family history on file.  No Known Allergies  Current Outpatient Prescriptions on File Prior to Visit  Medication Sig Dispense Refill  . acetaminophen (TYLENOL) 325 MG tablet Take 2 tablets (650 mg total) by mouth every 6 (six) hours as needed for mild pain (or Fever >/= 101).    Marland Kitchen albuterol (PROVENTIL HFA;VENTOLIN HFA) 108 (90 BASE) MCG/ACT inhaler Inhale 2 puffs into the lungs every 6 (six) hours as needed for wheezing or shortness of breath. 1 Inhaler 2  . budesonide-formoterol (SYMBICORT) 80-4.5 MCG/ACT inhaler Inhale 2 puffs into the lungs 2 (two) times daily. 1 Inhaler 12  . ibandronate (BONIVA) 150 MG tablet Take 1 tablet (150 mg total) by mouth every 30 (thirty) days. Take in the morning with a full glass of water, on an empty stomach, and do not take anything else by mouth or lie down for the next 30 min. 3 tablet 1  . Multiple Vitamins-Minerals (MULTIVITAMIN  WITH MINERALS) tablet Take 1 tablet by mouth daily.    . calcium-vitamin D (OSCAL WITH D) 500-200 MG-UNIT per tablet Take 1 tablet by mouth daily.    Marland Kitchen donepezil (ARICEPT) 10 MG tablet Take 10 mg by mouth daily. Take 1/2 tab twice daily    . predniSONE (DELTASONE) 10 MG tablet Take 1 tablet (10 mg total) by mouth daily. 2 tabs a day x 5 days (Patient not taking: Reported on 07/21/2014) 10 tablet 0   No current facility-administered medications on file prior to visit.    BP 133/65 mmHg  Pulse 73  Temp(Src) 98.3 F (36.8 C) (Oral)  Ht 5\' 3"  (1.6 m)  Wt  117 lb (53.071 kg)  BMI 20.73 kg/m2  SpO2 96%      Objective:   Physical Exam   General  Mental Status- Alert. Orientation- Orientation x 4.   Skin General:- Normal. Moisture- Dry. Temperature- Warm.  HEENT Head- normal.  Neck Neck- Supple.  Heart Ausculation-RRR  Lungs Ausculation- Clear, even, unlabored bilaterlly.    Abdomen Palpation/Percussion: Palpation and Percussion of the abdomen reveal- Mild suprapubic  Tender, No Rebound tenderness, No Rigidity(guarding), No Palpable abdominal masses and No jar tenderness. No suprapubic tenderness. Liver:-Normal. Spleen:- Normal. Other Characteristics- No Costovertebral angle tenderness- Left or Costovertebral angle tenderness- Right.  Auscultation: Auscultation of the abdomen reveals- Bowel Sounds normal.   Neurologic Cranial Nerve exam:- CN III-XII intact(No nystagmus), symmetric smile. Romberg Exam:- Negative.  Strength:- 5/5 equal and symmetric strength both upper and lower extremities.     Assessment & Plan:

## 2014-07-21 NOTE — Assessment & Plan Note (Signed)
Your appear to have a urinary tract infection. I am prescribing  cipro antibiotic for the probable infection. Hydrate well. I am sending out a urine culture. During the interim if your signs and symptoms worsen rather than improving please notify us. We will notify your when the culture results are back.  Follow up in 2 wks or as needed.  I do want to repeat urine in 2 weeks to make sure blood not in urine/clear.

## 2014-07-21 NOTE — Patient Instructions (Signed)
UTI (urinary tract infection) Your appear to have a urinary tract infection. I am prescribing  cipro antibiotic for the probable infection. Hydrate well. I am sending out a urine culture. During the interim if your signs and symptoms worsen rather than improving please notify us. We will notify your when the culture results are back.  Follow up in 2 wks or as needed.  I do want to repeat urine in 2 weeks to make sure blood not in urine/clear.   Dizziness and giddiness Not present on exam, negative neuruologic exam. Described as mild, transient and benign.  However, if severe and constant or with neurologic signs/ symptoms then ED evaluation.  For positional dizziness please be careful and get balance before ambulating.

## 2014-07-22 LAB — URINE CULTURE: Colony Count: >=100000

## 2014-07-23 ENCOUNTER — Telehealth: Payer: Self-pay

## 2014-07-23 MED ORDER — AMOXICILLIN 500 MG PO CAPS: 500.0000 mg | ORAL_CAPSULE | Freq: Two times a day (BID) | ORAL | Status: DC

## 2014-07-23 NOTE — Telephone Encounter (Signed)
-----   Message from Anabel Halon, PA-C sent at 07/23/2014  8:34 AM EST ----- Pt has strep agalactiae in urine. Atypical type bacteria in urine. I gave cipro. Would recommend stopping cipro and prescribing amoxicillin 500  mg #14 1 tab po bid x 7 days. Would you send that in.

## 2014-07-23 NOTE — Telephone Encounter (Signed)
Pt notified and made aware.  She agrees with plan.  Rx sent to pharmacy.

## 2014-08-04 ENCOUNTER — Ambulatory Visit (INDEPENDENT_AMBULATORY_CARE_PROVIDER_SITE_OTHER): Payer: Medicare Other | Admitting: Internal Medicine

## 2014-08-04 ENCOUNTER — Encounter: Payer: Self-pay | Admitting: Internal Medicine

## 2014-08-04 VITALS — BP 150/78 | HR 66 | Temp 98.1°F | Wt 121.4 lb

## 2014-08-04 DIAGNOSIS — N3001 Acute cystitis with hematuria: Secondary | ICD-10-CM

## 2014-08-04 DIAGNOSIS — J410 Simple chronic bronchitis: Secondary | ICD-10-CM

## 2014-08-04 DIAGNOSIS — M810 Age-related osteoporosis without current pathological fracture: Secondary | ICD-10-CM | POA: Diagnosis not present

## 2014-08-04 DIAGNOSIS — F0390 Unspecified dementia without behavioral disturbance: Secondary | ICD-10-CM

## 2014-08-04 NOTE — Assessment & Plan Note (Signed)
Urine culture showed GBS, status post Cipro and amoxicillin, now asymptomatic. No further testing indicated at this time

## 2014-08-04 NOTE — Progress Notes (Signed)
Pre visit review using our clinic review tool, if applicable. No additional management support is needed unless otherwise documented below in the visit note. 

## 2014-08-04 NOTE — Assessment & Plan Note (Signed)
not taking any medication, recommend the patient's son to see Dr. Cruzita Lederer if treatment is desired

## 2014-08-04 NOTE — Patient Instructions (Signed)
Once the pollen season starts back, go back on Symbicort 2 puffs twice a day Use albuterol only if cough or wheezing (this is your rescue inhaler)  Come back in 4-5 months for a  physical exam   Come back fasting

## 2014-08-04 NOTE — Assessment & Plan Note (Signed)
Currently doing great, recommend to restart Symbicort by the spring when she has most of her symptoms. Also we need to call [facility], they need to remove the oxygen tank she has for several months.

## 2014-08-04 NOTE — Assessment & Plan Note (Signed)
Seems stable, continue Aricept. She seems well taking care of

## 2014-08-04 NOTE — Progress Notes (Signed)
Subjective:    Patient ID: Donna Flowers, female    DOB: 1928/03/15, 81 y.o.   MRN: [id number]  DOS:  08/04/2014 Type of visit - description : f/u, here w/ her son [name] Interval history: Recently seen with a UTI, status post antibiotics, doing better. History of dementia, she remains at home, has appropriate  help, the son reports she looks stable, not  loosing ground.    Review of Systems No fever chills No nausea, vomiting, diarrhea No dysuria or gross hematuria  Past Medical History  Diagnosis Date  . COPD (chronic obstructive pulmonary disease)   . Osteoporosis   . Dementia   . Complication of anesthesia     lot of confusion    Past Surgical History  Procedure Laterality Date  . Joint replacement      right hip, left knee  . Ankle surgery      right    History   Social History  . Marital Status: Widowed    Spouse Name: N/A  . Number of Children: 2  . Years of Education: N/A   Occupational History  . elderly    Social History Main Topics  . Smoking status: Never Smoker   . Smokeless tobacco: Never Used  . Alcohol Use: No  . Drug Use: No  . Sexual Activity: Not on file   Other Topics Concern  . Not on file   Social History Narrative   Lives by herself.   Since admission 09-2013, has a sitter 8-5 pm.   [name] (son) [phone]   [name] (lives in [place])                 Medication List       This list is accurate as of: 08/04/14 11:59 PM.  Always use your most recent med list.               acetaminophen 325 MG tablet  Commonly known as:  TYLENOL  Take 2 tablets (650 mg total) by mouth every 6 (six) hours as needed for mild pain (or Fever >/= 101).     albuterol 108 (90 BASE) MCG/ACT inhaler  Commonly known as:  PROVENTIL HFA;VENTOLIN HFA  Inhale 2 puffs into the lungs every 6 (six) hours as needed for wheezing or shortness of breath.     budesonide-formoterol 80-4.5 MCG/ACT inhaler  Commonly known as:  SYMBICORT  Inhale 2 puffs  into the lungs 2 (two) times daily.     calcium-vitamin D 500-200 MG-UNIT per tablet  Commonly known as:  OSCAL WITH D  Take 1 tablet by mouth daily.     donepezil 10 MG tablet  Commonly known as:  ARICEPT  Take 10 mg by mouth daily. Take 1/2 tab twice daily     multivitamin with minerals tablet  Take 1 tablet by mouth daily.           Objective:   Physical Exam BP 150/78 mmHg  Pulse 66  Temp(Src) 98.1 F (36.7 C) (Oral)  Wt 121 lb 6 oz (55.055 kg)  SpO2 95%  General:   Well developed,  NAD.  HEENT:  Normocephalic . Face symmetric, atraumatic Lungs:  CTA B Normal respiratory effort, no intercostal retractions, no accessory muscle use. Heart: RRR,  no murmur.  Abdomen:  Not distended, soft, non-tender. No rebound or rigidity. No mass,organomegaly; no CVA tenderness Muscle skeletal: no pretibial edema bilaterally  Skin: Not pale. Not jaundice Neurologic:  alert ,gait impaired by ankle DJD  and cane  assisted Psych--   follow simple commands.No anxious or depressed appearing.      Assessment & Plan:

## 2014-08-05 ENCOUNTER — Telehealth: Payer: Self-pay | Admitting: Internal Medicine

## 2014-08-05 DIAGNOSIS — R0689 Other abnormalities of breathing: Principal | ICD-10-CM

## 2014-08-05 DIAGNOSIS — R0989 Other specified symptoms and signs involving the circulatory and respiratory systems: Secondary | ICD-10-CM

## 2014-08-05 NOTE — Telephone Encounter (Signed)
Please call [facility] home [facility],  Give a order to  remove the oxygen tank that was given to her a few months ago.

## 2014-08-06 NOTE — Telephone Encounter (Signed)
DME order placed for removal of O2 in home.

## 2014-08-06 NOTE — Telephone Encounter (Signed)
Staff message sent to [facility] with [facility].

## 2014-08-06 NOTE — Telephone Encounter (Signed)
DME orders signed by Dr. Larose Kells and faxed to [facility].

## 2014-09-15 ENCOUNTER — Other Ambulatory Visit: Payer: Self-pay

## 2014-10-12 DIAGNOSIS — L03115 Cellulitis of right lower limb: Secondary | ICD-10-CM | POA: Diagnosis not present

## 2014-10-13 ENCOUNTER — Other Ambulatory Visit (HOSPITAL_COMMUNITY): Payer: Self-pay | Admitting: Orthopedic Surgery

## 2014-10-13 ENCOUNTER — Encounter (HOSPITAL_COMMUNITY): Payer: PRIVATE HEALTH INSURANCE

## 2014-10-13 DIAGNOSIS — L03115 Cellulitis of right lower limb: Secondary | ICD-10-CM

## 2014-10-14 ENCOUNTER — Ambulatory Visit (HOSPITAL_COMMUNITY)
Admission: RE | Admit: 2014-10-14 | Discharge: 2014-10-14 | Disposition: A | Discharge status: Home / Self Care | Payer: Medicare Other | Source: Ambulatory Visit | Attending: Cardiology | Admitting: Cardiology

## 2014-10-14 DIAGNOSIS — L03115 Cellulitis of right lower limb: Secondary | ICD-10-CM | POA: Diagnosis not present

## 2014-10-14 DIAGNOSIS — M7989 Other specified soft tissue disorders: Secondary | ICD-10-CM | POA: Insufficient documentation

## 2014-10-19 ENCOUNTER — Encounter: Payer: Self-pay | Admitting: Internal Medicine

## 2014-10-19 ENCOUNTER — Other Ambulatory Visit: Payer: Self-pay

## 2014-10-19 ENCOUNTER — Ambulatory Visit (INDEPENDENT_AMBULATORY_CARE_PROVIDER_SITE_OTHER): Payer: Medicare Other | Admitting: Internal Medicine

## 2014-10-19 VITALS — BP 132/74 | HR 77 | Temp 97.7°F | Ht 63.0 in | Wt 122.1 lb

## 2014-10-19 DIAGNOSIS — J410 Simple chronic bronchitis: Secondary | ICD-10-CM | POA: Diagnosis not present

## 2014-10-19 DIAGNOSIS — R600 Localized edema: Secondary | ICD-10-CM

## 2014-10-19 DIAGNOSIS — F0390 Unspecified dementia without behavioral disturbance: Secondary | ICD-10-CM

## 2014-10-19 DIAGNOSIS — Z23 Encounter for immunization: Secondary | ICD-10-CM | POA: Diagnosis not present

## 2014-10-19 NOTE — Progress Notes (Signed)
Pre visit review using our clinic review tool, if applicable. No additional management support is needed unless otherwise documented below in the visit note. 

## 2014-10-19 NOTE — Patient Instructions (Signed)
please monitor the swelling, if it gets worse, please let me know  Next visit in 3 months for a checkup

## 2014-10-19 NOTE — Progress Notes (Signed)
Subjective:    Patient ID: Donna Flowers, female    DOB: 1927/06/28, 81 y.o.   MRN: [id number]  DOS:  10/19/2014 Type of visit - description : acute, here w/ son [name] Interval history: 3 weeks history of right leg swelling, apparently from mid thigh down. He went to orthopedic surgery, ultrasound was negative for a DVT 10/14/2014, she was prescribed Keflex. Edema is about the same. Weight is reviewed, no major shift on her weight. Dementia, good compliance with medications, her mental status is stable, actually  lately has been very good.  Review of Systems  No fever chills. Appetite is normal.  No cough or sputum production. Denies constipation or blood in the stools No vaginal bleeding  Past Medical History  Diagnosis Date  . COPD (chronic obstructive pulmonary disease)   . Osteoporosis   . Dementia   . Complication of anesthesia     lot of confusion    Past Surgical History  Procedure Laterality Date  . Joint replacement      right hip, left knee  . Ankle surgery      right    History   Social History  . Marital Status: Widowed    Spouse Name: N/A  . Number of Children: 2  . Years of Education: N/A   Occupational History  . elderly    Social History Main Topics  . Smoking status: Never Smoker   . Smokeless tobacco: Never Used  . Alcohol Use: No  . Drug Use: No  . Sexual Activity: Not on file   Other Topics Concern  . Not on file   Social History Narrative   Lives by herself.   Since admission 09-2013, has a sitter 8-5 pm.   [name] (son) [phone]   [name] (lives in [place])                 Medication List       This list is accurate as of: 10/19/14 11:59 PM.  Always use your most recent med list.               acetaminophen 325 MG tablet  Commonly known as:  TYLENOL  Take 2 tablets (650 mg total) by mouth every 6 (six) hours as needed for mild pain (or Fever >/= 101).     albuterol 108 (90 BASE) MCG/ACT inhaler  Commonly known as:   PROVENTIL HFA;VENTOLIN HFA  Inhale 2 puffs into the lungs every 6 (six) hours as needed for wheezing or shortness of breath.     budesonide-formoterol 80-4.5 MCG/ACT inhaler  Commonly known as:  SYMBICORT  Inhale 2 puffs into the lungs 2 (two) times daily.     calcium-vitamin D 500-200 MG-UNIT per tablet  Commonly known as:  OSCAL WITH D  Take 1 tablet by mouth daily.     cephALEXin 250 MG capsule  Commonly known as:  KEFLEX  Take 1 capsule by mouth 4 (four) times daily. For 7 days     donepezil 10 MG tablet  Commonly known as:  ARICEPT  Take 10 mg by mouth daily. Take 1/2 tab twice daily     multivitamin with minerals tablet  Take 1 tablet by mouth daily.           Objective:   Physical Exam BP 132/74 mmHg  Pulse 77  Temp(Src) 97.7 F (36.5 C) (Oral)  Ht 5\' 3"  (1.6 m)  Wt 122 lb 2 oz (55.396 kg)  BMI 21.64 kg/m2  SpO2 95% General:   Well developed, well nourished    HEENT:  Normocephalic . Face symmetric, atraumatic Lungs:  CTA B Normal respiratory effort, no intercostal retractions, no accessory muscle use. Heart: RRR,  no murmur.  no pretibial edema bilaterally  Abdomen:  Not distended, soft, non-tender. No rebound or rigidity. No mass,organomegaly Legs: Thigh is 3/4 inch larger in diameter on the right, calf is 1/2  inch larger in diameter on the right. + Pitting edema pretibially. Few scratches at both pretibial area, no obvious cellulitis. Good femoral pulses bilaterally. Skin: Not pale. Not jaundice Neurologic:  alert , please send, cooperative, knows her name and date of birth, had a hard time remembering her  address, not oriented in time. Recognizes her son. Speech normal, gait appropriate for age and unassisted Psych--  Cognition and judgment appear intact.  Cooperative with normal attention span and concentration.  Behavior appropriate. No anxious or depressed appearing.       Assessment & Plan:     Dementia, stable, refill  medications  COPD, Stable, Prevnar today  Today , I spent more than 25   min with the patient: >50% of the time counseling regards pros and cons of further evaluation for edema

## 2014-10-20 DIAGNOSIS — R600 Localized edema: Secondary | ICD-10-CM | POA: Insufficient documentation

## 2014-10-20 NOTE — Assessment & Plan Note (Signed)
Right lower extremity edema. Etiology unclear, recent ultrasound @ orthopedic surgery neg for DVT. No evidence of cellulitis, to finish Keflex as prescribed by orthopedic. No CHF  Swelling could be related to intrapelvic process, we discussed with the son further workup but we agreed that is not in her best interest. Swelling could be related to previous hip surgery. Plan: Reassess in 3 months. Son will call if they swelling worse

## 2014-11-05 ENCOUNTER — Telehealth (HOSPITAL_COMMUNITY): Payer: Self-pay | Admitting: *Deleted

## 2014-11-12 ENCOUNTER — Other Ambulatory Visit: Payer: Self-pay | Admitting: Internal Medicine

## 2014-11-15 NOTE — Telephone Encounter (Signed)
She needs 5 mg twice a day, either : 10 mg tabs -- half twice a day Or  5 mg tabs-- one twice a day

## 2014-11-15 NOTE — Telephone Encounter (Signed)
Should Pt be taking 1/2 tablet bid or 1/2 daily. Viewed notes from last OV and per med list, there is 2 different sigs. Please advise.

## 2015-01-05 ENCOUNTER — Encounter: Payer: PRIVATE HEALTH INSURANCE | Admitting: Internal Medicine

## 2015-02-22 ENCOUNTER — Other Ambulatory Visit: Payer: Self-pay

## 2015-02-22 MED ORDER — DONEPEZIL HCL 10 MG PO TABS: 5.0000 mg | ORAL_TABLET | Freq: Two times a day (BID) | ORAL | Status: DC

## 2015-03-01 ENCOUNTER — Encounter: Payer: Self-pay | Admitting: Behavioral Health

## 2015-03-01 ENCOUNTER — Telehealth: Payer: Self-pay | Admitting: Behavioral Health

## 2015-03-01 NOTE — Telephone Encounter (Signed)
Pre-Visit Call completed with patient and chart updated.   Pre-Visit Info documented in Specialty Comments under [employer].    

## 2015-03-02 ENCOUNTER — Encounter: Payer: Self-pay | Admitting: Internal Medicine

## 2015-03-02 ENCOUNTER — Ambulatory Visit (INDEPENDENT_AMBULATORY_CARE_PROVIDER_SITE_OTHER): Payer: Medicare Other | Admitting: Internal Medicine

## 2015-03-02 VITALS — BP 124/68 | HR 69 | Temp 97.5°F | Ht 63.0 in | Wt 129.1 lb

## 2015-03-02 DIAGNOSIS — J438 Other emphysema: Secondary | ICD-10-CM | POA: Diagnosis not present

## 2015-03-02 DIAGNOSIS — Z0000 Encounter for general adult medical examination without abnormal findings: Secondary | ICD-10-CM

## 2015-03-02 DIAGNOSIS — F0390 Unspecified dementia without behavioral disturbance: Secondary | ICD-10-CM

## 2015-03-02 DIAGNOSIS — R600 Localized edema: Secondary | ICD-10-CM

## 2015-03-02 DIAGNOSIS — Z09 Encounter for follow-up examination after completed treatment for conditions other than malignant neoplasm: Secondary | ICD-10-CM

## 2015-03-02 DIAGNOSIS — M810 Age-related osteoporosis without current pathological fracture: Secondary | ICD-10-CM | POA: Diagnosis not present

## 2015-03-02 NOTE — Patient Instructions (Signed)
Get your blood work before you leave   Please get to high-dose shot  Later on get a shingles shot if so desired  Next visit 6-8 months for a routine checkup   (30 minutes)    Fall Prevention and Home Safety Falls cause injuries and can affect all age groups. It is possible to use preventive measures to significantly decrease the likelihood of falls. There are many simple measures which can make your home safer and prevent falls. OUTDOORS  Repair cracks and edges of walkways and driveways.  Remove high doorway thresholds.  Trim shrubbery on the main path into your home.  Have good outside lighting.  Clear walkways of tools, rocks, debris, and clutter.  Check that handrails are not broken and are securely fastened. Both sides of steps should have handrails.  Have leaves, snow, and ice cleared regularly.  Use sand or salt on walkways during winter months.  In the garage, clean up grease or oil spills. BATHROOM  Install night lights.  Install grab bars by the toilet and in the tub and shower.  Use non-skid mats or decals in the tub or shower.  Place a plastic non-slip stool in the shower to sit on, if needed.  Keep floors dry and clean up all water on the floor immediately.  Remove soap buildup in the tub or shower on a regular basis.  Secure bath mats with non-slip, double-sided rug tape.  Remove throw rugs and tripping hazards from the floors. BEDROOMS  Install night lights.  Make sure a bedside light is easy to reach.  Do not use oversized bedding.  Keep a telephone by your bedside.  Have a firm chair with side arms to use for getting dressed.  Remove throw rugs and tripping hazards from the floor. KITCHEN  Keep handles on pots and pans turned toward the center of the stove. Use back burners when possible.  Clean up spills quickly and allow time for drying.  Avoid walking on wet floors.  Avoid hot utensils and knives.  Position shelves so they are  not too high or low.  Place commonly used objects within easy reach.  If necessary, use a sturdy step stool with a grab bar when reaching.  Keep electrical cables out of the way.  Do not use floor polish or wax that makes floors slippery. If you must use wax, use non-skid floor wax.  Remove throw rugs and tripping hazards from the floor. STAIRWAYS  Never leave objects on stairs.  Place handrails on both sides of stairways and use them. Fix any loose handrails. Make sure handrails on both sides of the stairways are as long as the stairs.  Check carpeting to make sure it is firmly attached along stairs. Make repairs to worn or loose carpet promptly.  Avoid placing throw rugs at the top or bottom of stairways, or properly secure the rug with carpet tape to prevent slippage. Get rid of throw rugs, if possible.  Have an electrician put in a light switch at the top and bottom of the stairs. OTHER FALL PREVENTION TIPS  Wear low-heel or rubber-soled shoes that are supportive and fit well. Wear closed toe shoes.  When using a stepladder, make sure it is fully opened and both spreaders are firmly locked. Do not climb a closed stepladder.  Add color or contrast paint or tape to grab bars and handrails in your home. Place contrasting color strips on first and last steps.  Learn and use mobility aids as  needed. Install an electrical emergency response system.  Turn on lights to avoid dark areas. Replace light bulbs that burn out immediately. Get light switches that glow.  Arrange furniture to create clear pathways. Keep furniture in the same place.  Firmly attach carpet with non-skid or double-sided tape.  Eliminate uneven floor surfaces.  Select a carpet pattern that does not visually hide the edge of steps.  Be aware of all pets. OTHER HOME SAFETY TIPS  Set the water temperature for 120 F (48.8 C).  Keep emergency numbers on or near the telephone.  Keep smoke detectors on  every level of the home and near sleeping areas. Document Released: 05/18/2002 Document Revised: 11/27/2011 Document Reviewed: 08/17/2011 [facility] Patient Information 2015 [place], [place]. This information is not intended to replace advice given to you by your health care provider. Make sure you discuss any questions you have with your health care provider.   Preventive Care for Adults Ages 3 and over  Blood pressure check.** / Every 1 to 2 years.  Lipid and cholesterol check.**/ Every 5 years beginning at age 60.  Lung cancer screening. / Every year if you are aged 93-80 years and have a 30-pack-year history of smoking and currently smoke or have quit within the past 15 years. Yearly screening is stopped once you have quit smoking for at least 15 years or develop a health problem that would prevent you from having lung cancer treatment.  Fecal occult blood test (FOBT) of stool. / Every year beginning at age 60 and continuing until age 27. You may not have to do this test if you get a colonoscopy every 10 years.  Flexible sigmoidoscopy** or colonoscopy.** / Every 5 years for a flexible sigmoidoscopy or every 10 years for a colonoscopy beginning at age 81 and continuing until age 50.  Hepatitis C blood test.** / For all people born from [birth year] through 1963 and any individual with known risks for hepatitis C.  Abdominal aortic aneurysm (AAA) screening.** / A one-time screening for ages 8 to 48 years who are current or former smokers.  Skin self-exam. / Monthly.  Influenza vaccine. / Every year.  Tetanus, diphtheria, and acellular pertussis (Tdap/Td) vaccine.** / 1 dose of Td every 10 years.  Varicella vaccine.** / Consult your health care provider.  Zoster vaccine.** / 1 dose for adults aged 57 years or older.  Pneumococcal 13-valent conjugate (PCV13) vaccine.** / Consult your health care provider.  Pneumococcal polysaccharide (PPSV23) vaccine.** / 1 dose for all adults aged 44 years  and older.  Meningococcal vaccine.** / Consult your health care provider.  Hepatitis A vaccine.** / Consult your health care provider.  Hepatitis B vaccine.** / Consult your health care provider.  Haemophilus influenzae type b (Hib) vaccine.** / Consult your health care provider. **Family history and personal history of risk and conditions may change your health care provider's recommendations. Document Released: 07/24/2001 Document Revised: 06/02/2013 Document Reviewed: 10/23/2010 [facility] Patient Information 2015 [place], [place]. This information is not intended to replace advice given to you by your health care provider. Make sure you discuss any questions you have with your health care provider.

## 2015-03-02 NOTE — Assessment & Plan Note (Addendum)
Td 2008, pnm shot 2013, prevnar 2016. Needs a higher dose of flu shot, we are out, recommend to go to her local pharmacy zostavax discussed, Rx printed again today No further screenings. She is doing very well, has a supportive family and get the help she needs. See social history Labs

## 2015-03-02 NOTE — Progress Notes (Signed)
Pre visit review using our clinic review tool, if applicable. No additional management support is needed unless otherwise documented below in the visit note. 

## 2015-03-02 NOTE — Progress Notes (Signed)
Subjective:    Patient ID: Donna Flowers, female    DOB: 08/04/1927, 81 y.o.   MRN: [id number]  DOS:  03/02/2015 Type of visit - description : Here with her son [name]  Here for Medicare AWV: 1. Risk factors based on Past M, S, F history: reviewed 2. Physical Activities:  Active with  Home chores 3. Depression/mood: neg screening   4. Hearing: No problemss noted or reported   5. ADL's:  no driving, able to self care except needs help w/ paper work-fiances 6. Fall Risk: no recent falls , precautions discussed   7. home Safety: does feel safe at home   8. Height, weight, & visual acuity: see VS, sees the eye doctor 9. Counseling: provided 10. Labs ordered based on risk factors: if needed   11. Referral Coordination: if needed 12. Care Plan, see assessment and plan   13. Cognitive Assessment: Frail lady, motor skills and cognition  limited. 14. List of providers : does not seen other MDs regularly  15. End of life care discussed, [name] (son) is the [place]    In addition, today we discussed the following: Dementia: Good compliance of medication, [name] reports that there has been no deterioration DJD: Essentially asymptomatic, occasionally takes Tylenol COPD: Symptoms are stable, taking meds as needed Right lower extremity edema: Better compared to the last time she was here,   Review of Systems  Constitutional: No fever. No chills. No unexplained wt changes. No unusual sweats  HEENT: No dental problems, no ear discharge, no facial swelling, no voice changes. No eye discharge, no eye  redness , no  intolerance to light   Respiratory: Symptoms at baseline, no hemoptysis or mucus production  Cardiovascular: No CP, no leg swelling , no  Palpitations  GI: no nausea, no vomiting, no diarrhea , no  abdominal pain.  No blood in the stools. No dysphagia, no odynophagia    Endocrine: No polyphagia, no polyuria , no polydipsia  GU: No dysuria, gross hematuria, difficulty urinating. No  urinary urgency, no frequency.  Musculoskeletal: DJD symptoms at baseline  Skin: No change in the color of the skin, palor , no  Rash  Allergic, immunologic: No environmental allergies , no  food allergies  Neurological: No dizziness no  syncope. No headaches. No diplopia, no slurred, no slurred speech, no motor deficits, no facial  Numbness  Hematological: No enlarged lymph nodes, no easy bruising , no unusual bleedings  Psychiatry: No suicidal ideas, no hallucinations, no beavior problems .  No unusual/severe anxiety, no depression   Past Medical History  Diagnosis Date  . COPD (chronic obstructive pulmonary disease)   . Osteoporosis   . Dementia   . Complication of anesthesia     lot of confusion    Past Surgical History  Procedure Laterality Date  . Joint replacement      right hip, left knee  . Ankle surgery      right    Social History   Social History  . Marital Status: Widowed    Spouse Name: N/A  . Number of Children: 2  . Years of Education: N/A   Occupational History  . elderly    Social History Main Topics  . Smoking status: Never Smoker   . Smokeless tobacco: Never Used  . Alcohol Use: No  . Drug Use: No  . Sexual Activity: Not on file   Other Topics Concern  . Not on file   Social History Narrative  Lives by herself.   Since admission 09-2013, has a sitter 8-5 pm.   [name] (son) [phone]   [name] (lives in [place])              Family History  Problem Relation Age of Onset  . Colon cancer Neg Hx   . Breast cancer Neg Hx   . Brain cancer Father        Medication List       This list is accurate as of: 03/02/15 11:59 PM.  Always use your most recent med list.               acetaminophen 325 MG tablet  Commonly known as:  TYLENOL  Take 2 tablets (650 mg total) by mouth every 6 (six) hours as needed for mild pain (or Fever >/= 101).     albuterol 108 (90 BASE) MCG/ACT inhaler  Commonly known as:  PROVENTIL HFA;VENTOLIN HFA    Inhale 2 puffs into the lungs every 6 (six) hours as needed for wheezing or shortness of breath.     budesonide-formoterol 80-4.5 MCG/ACT inhaler  Commonly known as:  SYMBICORT  Inhale 2 puffs into the lungs 2 (two) times daily.     calcium-vitamin D 500-200 MG-UNIT per tablet  Commonly known as:  OSCAL WITH D  Take 1 tablet by mouth daily.     donepezil 10 MG tablet  Commonly known as:  ARICEPT  Take 0.5 tablets (5 mg total) by mouth 2 (two) times daily.     multivitamin with minerals tablet  Take 1 tablet by mouth daily.           Objective:   Physical Exam BP 124/68 mmHg  Pulse 69  Temp(Src) 97.5 F (36.4 C) (Oral)  Ht 5\' 3"  (1.6 m)  Wt 129 lb 2 oz (58.571 kg)  BMI 22.88 kg/m2  SpO2 96% General:   Well developed, well nourished . NAD.  HEENT:  Normocephalic . Face symmetric, atraumatic Neck: No thyromegaly Lungs:  CTA B Normal respiratory effort, no intercostal retractions, no accessory muscle use. Heart: RRR,  no murmur.  Left calf is 1 cm larger in circumference, + pitting edema. Abdomen:  Not distended, soft, non-tender. No rebound or rigidity. No bruit Skin:  No plae or jaundice Neurologic:  Alert. Oriented to self, space. Not oriented in time. Recognizes her son, knows what she had this morning for breakfast Speech normal, quite limited due to DJD Psych--  Behavior appropriate. No anxious or depressed appearing.    Assessment & Plan:    Assessment > DJD, used to see [facility] [facility] COPD -- on meds prn Osteoporosis - s/p forteo before, last dexa 2013: osteoporosis, saw Dr Cruzita Lederer 2015 rec prolia   Urinary incontinence Deementia--- aricept started ~ 2011 Skin ca-- SCC, does not see derm regularly  Left leg edema: (-) [place] ~ 10-2014  Plan   dementia: Stable, continue with Aricept Check labs COPD: Doing very well, uses medications as needed only DJD: Doing very well with Tylenol as needed Osteoporosis: dexa 2013: osteoporosis endocrinology rec prolia  last year but that never happened.  Risk of FX discussed with the patient and her son, thy agreed on prolia, will set that up and repeat a bone density test if requested by the insurance company Left lower extremity edema, improving Form filled  RTC 6, 8 months  Today, I spent more than  45  min with the patient: >50% of the time counseling regards pros and cons of  osteoporosis treatment, risk factors, filling paperwork. Reviewing endocrine notes.

## 2015-03-03 ENCOUNTER — Telehealth: Payer: Self-pay

## 2015-03-03 DIAGNOSIS — Z09 Encounter for follow-up examination after completed treatment for conditions other than malignant neoplasm: Secondary | ICD-10-CM | POA: Insufficient documentation

## 2015-03-03 LAB — CBC WITH DIFFERENTIAL/PLATELET
BASOS ABS: 0.1 10*3/uL (ref 0.0–0.1)
Basophils Relative: 1.8 % (ref 0.0–3.0)
EOS ABS: 0.2 10*3/uL (ref 0.0–0.7)
Eosinophils Relative: 2.3 % (ref 0.0–5.0)
HEMATOCRIT: 42.1 % (ref 36.0–46.0)
HEMOGLOBIN: 13.8 g/dL (ref 12.0–15.0)
LYMPHS PCT: 21.1 % (ref 12.0–46.0)
Lymphs Abs: 1.6 10*3/uL (ref 0.7–4.0)
MCHC: 32.7 g/dL (ref 30.0–36.0)
MCV: 87.5 fl (ref 78.0–100.0)
MONOS PCT: 8.2 % (ref 3.0–12.0)
Monocytes Absolute: 0.6 10*3/uL (ref 0.1–1.0)
Neutro Abs: 5 10*3/uL (ref 1.4–7.7)
Neutrophils Relative %: 66.6 % (ref 43.0–77.0)
Platelets: 179 10*3/uL (ref 150.0–400.0)
RBC: 4.81 Mil/uL (ref 3.87–5.11)
RDW: 15.2 % (ref 11.5–15.5)
WBC: 7.5 10*3/uL (ref 4.0–10.5)

## 2015-03-03 LAB — BASIC METABOLIC PANEL
BUN: 14 mg/dL (ref 6–23)
CALCIUM: 9.5 mg/dL (ref 8.4–10.5)
CHLORIDE: 104 meq/L (ref 96–112)
CO2: 27 mEq/L (ref 19–32)
CREATININE: 0.82 mg/dL (ref 0.40–1.20)
GFR: 70.44 mL/min (ref >60.00)
Glucose, Bld: 75 mg/dL (ref 70–99)
Potassium: 4.5 mEq/L (ref 3.5–5.1)
SODIUM: 140 meq/L (ref 135–145)

## 2015-03-03 LAB — TSH: TSH: 1.09 u[IU]/mL (ref 0.35–4.50)

## 2015-03-03 NOTE — Telephone Encounter (Signed)
Spoke with [name], Pt's son, informed him that the [place] for [place] Examination Report has been completed by Dr. Larose Kells and I have placed at the front desk for him to pick up. John verbalized understanding. Copy of form sent to scanning.

## 2015-03-03 NOTE — Assessment & Plan Note (Signed)
dementia: Stable, continue with Aricept Check labs COPD: Doing very well, uses medications as needed only DJD: Doing very well with Tylenol as needed Osteoporosis: dexa 2013: osteoporosis endocrinology rec prolia last year but that never happened.  Risk of FX discussed with the patient and her son, thy agreed on prolia, will set that up and repeat a bone density test if requested by the insurance company Left lower extremity edema, improving Form filled  RTC 6, 8 months

## 2015-04-06 ENCOUNTER — Telehealth: Payer: Self-pay

## 2015-04-06 NOTE — Telephone Encounter (Signed)
Insurance verification started on [employer], awaiting insurance determination. Insurance verification request form sent for scanning.

## 2015-05-01 ENCOUNTER — Emergency Department (HOSPITAL_COMMUNITY): Payer: Medicare Other

## 2015-05-01 ENCOUNTER — Encounter (HOSPITAL_COMMUNITY): Payer: Self-pay | Admitting: *Deleted

## 2015-05-01 ENCOUNTER — Emergency Department (HOSPITAL_COMMUNITY)
Admission: EM | Admit: 2015-05-01 | Discharge: 2015-05-02 | Disposition: A | Discharge status: Home / Self Care | Payer: Medicare Other | Attending: Emergency Medicine | Admitting: Emergency Medicine

## 2015-05-01 DIAGNOSIS — M810 Age-related osteoporosis without current pathological fracture: Secondary | ICD-10-CM | POA: Insufficient documentation

## 2015-05-01 DIAGNOSIS — R0600 Dyspnea, unspecified: Secondary | ICD-10-CM | POA: Diagnosis not present

## 2015-05-01 DIAGNOSIS — J449 Chronic obstructive pulmonary disease, unspecified: Secondary | ICD-10-CM | POA: Insufficient documentation

## 2015-05-01 DIAGNOSIS — R1011 Right upper quadrant pain: Secondary | ICD-10-CM

## 2015-05-01 DIAGNOSIS — R601 Generalized edema: Secondary | ICD-10-CM | POA: Diagnosis not present

## 2015-05-01 DIAGNOSIS — R1084 Generalized abdominal pain: Secondary | ICD-10-CM

## 2015-05-01 DIAGNOSIS — F0390 Unspecified dementia without behavioral disturbance: Secondary | ICD-10-CM | POA: Diagnosis not present

## 2015-05-01 DIAGNOSIS — N390 Urinary tract infection, site not specified: Secondary | ICD-10-CM | POA: Diagnosis not present

## 2015-05-01 DIAGNOSIS — Z79899 Other long term (current) drug therapy: Secondary | ICD-10-CM | POA: Diagnosis not present

## 2015-05-01 DIAGNOSIS — R109 Unspecified abdominal pain: Secondary | ICD-10-CM | POA: Diagnosis not present

## 2015-05-01 DIAGNOSIS — E109 Type 1 diabetes mellitus without complications: Secondary | ICD-10-CM | POA: Diagnosis present

## 2015-05-01 LAB — URINALYSIS, ROUTINE W REFLEX MICROSCOPIC
Bilirubin Urine: NEGATIVE
GLUCOSE, UA: NEGATIVE mg/dL
Hgb urine dipstick: NEGATIVE
KETONES UR: NEGATIVE mg/dL
NITRITE: POSITIVE — AB
PH: 7.5 (ref 5.0–8.0)
PROTEIN: NEGATIVE mg/dL
Specific Gravity, Urine: 1.018 (ref 1.005–1.030)

## 2015-05-01 LAB — COMPREHENSIVE METABOLIC PANEL
ALBUMIN: 3.7 g/dL (ref 3.5–5.0)
ALT: 14 U/L (ref 14–54)
ANION GAP: 10 (ref 5–15)
AST: 21 U/L (ref 15–41)
Alkaline Phosphatase: 85 U/L (ref 38–126)
BUN: 17 mg/dL (ref 6–20)
CHLORIDE: 104 mmol/L (ref 101–111)
CO2: 27 mmol/L (ref 22–32)
Calcium: 9.7 mg/dL (ref 8.9–10.3)
Creatinine, Ser: 0.9 mg/dL (ref 0.44–1.00)
GFR calc Af Amer: >60 mL/min (ref >60)
GFR calc non Af Amer: 57 mL/min — ABNORMAL LOW (ref >60)
GLUCOSE: 132 mg/dL — AB (ref 65–99)
POTASSIUM: 3.7 mmol/L (ref 3.5–5.1)
Sodium: 141 mmol/L (ref 135–145)
Total Bilirubin: 0.1 mg/dL — ABNORMAL LOW (ref 0.3–1.2)
Total Protein: 6.7 g/dL (ref 6.5–8.1)

## 2015-05-01 LAB — CBC WITH DIFFERENTIAL/PLATELET
BASOS ABS: 0 10*3/uL (ref 0.0–0.1)
BASOS PCT: 0 %
EOS ABS: 0.2 10*3/uL (ref 0.0–0.7)
EOS PCT: 2 %
HCT: 41.2 % (ref 36.0–46.0)
Hemoglobin: 13.7 g/dL (ref 12.0–15.0)
Lymphocytes Relative: 17 %
Lymphs Abs: 1.6 10*3/uL (ref 0.7–4.0)
MCH: 28.8 pg (ref 26.0–34.0)
MCHC: 33.3 g/dL (ref 30.0–36.0)
MCV: 86.7 fL (ref 78.0–100.0)
MONO ABS: 1 10*3/uL (ref 0.1–1.0)
MONOS PCT: 11 %
Neutro Abs: 6.4 10*3/uL (ref 1.7–7.7)
Neutrophils Relative %: 70 %
PLATELETS: 193 10*3/uL (ref 150–400)
RBC: 4.75 MIL/uL (ref 3.87–5.11)
RDW: 14.6 % (ref 11.5–15.5)
WBC: 9.2 10*3/uL (ref 4.0–10.5)

## 2015-05-01 LAB — URINE MICROSCOPIC-ADD ON
RBC / HPF: NONE SEEN RBC/hpf (ref 0–5)
Squamous Epithelial / LPF: NONE SEEN

## 2015-05-01 LAB — LIPASE, BLOOD: LIPASE: 40 U/L (ref 11–51)

## 2015-05-01 LAB — I-STAT TROPONIN, ED: Troponin i, poc: 0 ng/mL (ref 0.00–0.08)

## 2015-05-01 LAB — I-STAT CREATININE, ED: CREATININE: 1 mg/dL (ref 0.44–1.00)

## 2015-05-01 MED ORDER — ONDANSETRON HCL 4 MG/2ML IJ SOLN
4.0000 mg | Freq: Once | INTRAMUSCULAR | Status: AC
  Administered 2015-05-01: 4 mg via INTRAVENOUS
  Filled 2015-05-01: qty 2

## 2015-05-01 MED ORDER — IOHEXOL 300 MG/ML  SOLN
100.0000 mL | Freq: Once | INTRAMUSCULAR | Status: AC | PRN
  Administered 2015-05-01: 100 mL via INTRAVENOUS

## 2015-05-01 MED ORDER — CEFTRIAXONE SODIUM 1 G IJ SOLR
1.0000 g | Freq: Once | INTRAMUSCULAR | Status: AC
  Administered 2015-05-01: 1 g via INTRAVENOUS
  Filled 2015-05-01: qty 10

## 2015-05-01 MED ORDER — SODIUM CHLORIDE 0.9 % IV BOLUS (SEPSIS)
1000.0000 mL | Freq: Once | INTRAVENOUS | Status: AC
  Administered 2015-05-01: 1000 mL via INTRAVENOUS

## 2015-05-01 MED ORDER — CEPHALEXIN 500 MG PO CAPS: 1000.0000 mg | ORAL_CAPSULE | Freq: Two times a day (BID) | ORAL | Status: DC

## 2015-05-01 MED ORDER — MORPHINE SULFATE (PF) 4 MG/ML IV SOLN
4.0000 mg | Freq: Once | INTRAVENOUS | Status: AC
Start: 2015-05-01 — End: 2015-05-01
  Administered 2015-05-01: 4 mg via INTRAVENOUS
  Filled 2015-05-01: qty 1

## 2015-05-01 MED ORDER — MORPHINE SULFATE (PF) 4 MG/ML IV SOLN
4.0000 mg | Freq: Once | INTRAVENOUS | Status: AC
  Administered 2015-05-01: 4 mg via INTRAVENOUS
  Filled 2015-05-01: qty 1

## 2015-05-01 NOTE — Discharge Instructions (Signed)

## 2015-05-01 NOTE — ED Notes (Signed)
Patient transported to CT 

## 2015-05-01 NOTE — ED Notes (Signed)
Family reports that pt has had abd / torso pain since Fri; family reports that pain got worse this evening after dinner; family reports that they were trying to put her to bed and that the pain increased when she laid down; pt denies N/V; pt c/o pain to bilateral lower rib area and abd; pt with shallow respirations, pain is worse normal inhalation

## 2015-05-01 NOTE — ED Provider Notes (Signed)
CSN: [id number]     Arrival date & time 05/01/15  2045 History   First MD Initiated Contact with Patient 05/01/15 2058     Chief Complaint  Patient presents with  . Abdominal Pain     (Consider location/radiation/quality/duration/timing/severity/associated sxs/prior Treatment) HPI   81 year old female with history of dementia, osteoporosis, COPD brought here to the ED, accompanied by her sons for evaluation of abdominal pain. Per son, for the past 2 days patient has been holding her abdomen when walking and complaining of pain. This is not usual for her. Seems to be worsening with ambulation after eating. Today after eating, she once again complaining of abdominal pain. When son brought patient back home and put out of bed she then complained of back pain. Patient brought here for further evaluation. At this time patient denies having fever, headache, chest pain, trouble breathing, dysuria, bowel bladder incontinence or saddle anesthesia. With history of dementia, patient is a poor historian. Per son, patient is at home by herself and is able to perform her ADL.  Past Medical History  Diagnosis Date  . COPD (chronic obstructive pulmonary disease) ([place])   . Osteoporosis   . Dementia   . Complication of anesthesia     lot of confusion   Past Surgical History  Procedure Laterality Date  . Joint replacement      right hip, left knee  . Ankle surgery      right   Family History  Problem Relation Age of Onset  . Colon cancer Neg Hx   . Breast cancer Neg Hx   . Brain cancer Father    Social History  Substance Use Topics  . Smoking status: Never Smoker   . Smokeless tobacco: Never Used  . Alcohol Use: No   OB History    No data available     Review of Systems  Unable to perform ROS: Dementia      Allergies  Review of patient's allergies indicates no known allergies.  Home Medications   Prior to Admission medications   Medication Sig Start Date End Date Taking?  Authorizing Provider  acetaminophen (TYLENOL) 325 MG tablet Take 2 tablets (650 mg total) by mouth every 6 (six) hours as needed for mild pain (or Fever >/= 101). 09/28/13   Kinnie Feil, MD  albuterol (PROVENTIL HFA;VENTOLIN HFA) 108 (90 BASE) MCG/ACT inhaler Inhale 2 puffs into the lungs every 6 (six) hours as needed for wheezing or shortness of breath. 09/28/13   Kinnie Feil, MD  budesonide-formoterol (SYMBICORT) 80-4.5 MCG/ACT inhaler Inhale 2 puffs into the lungs 2 (two) times daily. 09/28/13   Kinnie Feil, MD  calcium-vitamin D (OSCAL WITH D) 500-200 MG-UNIT per tablet Take 1 tablet by mouth daily.    Historical Provider, MD  donepezil (ARICEPT) 10 MG tablet Take 0.5 tablets (5 mg total) by mouth 2 (two) times daily. 02/22/15   Colon Branch, MD  Multiple Vitamins-Minerals (MULTIVITAMIN WITH MINERALS) tablet Take 1 tablet by mouth daily.    Historical Provider, MD   BP 159/88 mmHg  Pulse 91  Temp(Src) 97.5 F (36.4 C) (Oral)  Resp 16  SpO2 99% Physical Exam  Constitutional: She appears well-developed and well-nourished. No distress.  HENT:  Head: Atraumatic.  Eyes: Conjunctivae are normal.  Neck: Neck supple.  Cardiovascular: Normal rate and regular rhythm.   Pulmonary/Chest: Effort normal and breath sounds normal.  Shallow breathing without wheezes, rales, rhonchi heard  Abdominal: She exhibits distension. There is tenderness (diffuse  abdominal tenderness without guarding or rebound tenderness.).  Genitourinary:  No CVA tenderness. Strong urine odor noted.  Chaperone present during exam: a lipoma noted adjacent to L superior labial region, nontender non infected.    Musculoskeletal:  No midline spine tenderness crepitus or step-off.  Neurological: She is alert. GCS eye subscore is 4. GCS verbal subscore is 5. GCS motor subscore is 6.  Skin: No rash noted.  Psychiatric: She has a normal mood and affect.  Nursing note and vitals reviewed.   ED Course  Procedures  (including critical care time)  Elderly demented lady with complaint of abdominal pain. She does have reproducible abdominal pain and exam. She is afebrile, vital signs stable. She is not hypoxic. Workup initiated, pain medication given. Care discussed with Dr. Ralene Bathe.  10:36 PM Urine with positive nitrites and small leukocyte esterase with many bacteria. This is consistence with a urinary tract infection. Patient will receive Rocephin as treatment. No leukocytosis and her electrolytes are unremarkable.  11:34 PM Abd/pelvis CT showing mild pericholecystic edema which may indicate cholecystitis although no stones are demonstrated.  Stool-filled colon suggesting constipation.  No evidence of diverticulitis.  Pt currently tolerates PO without difficulty.  On reexamination her abd pain has improved.  Given her age, a UA showing signs of UTI with strong urine odor i suspect her pain is due to UTI.  She will be discharge with antibiotic, Keflex.  Urine culture sent.  I discussed finding with pt's son.  If no improvement of sxs despite antibiotic or if pt has worsening post prandial pain then she will need to return, and an abd [place] may be needed to r/o cholecystitis.    11:41 PM After discussed with Dr. Ralene Bathe, we decided to obtain abd [place] to ensure no evidence of cholecystitis.  Pt and family member agrees.    11:58 PM Care discussed with oncoming provider who will f/u on [place] result.  If negative, pt will be discharged with Keflex and outpt f/u.    Labs Review Labs Reviewed  COMPREHENSIVE METABOLIC PANEL - Abnormal; Notable for the following:    Glucose, Bld 132 (*)    Total Bilirubin 0.1 (*)    GFR calc non Af Amer 57 (*)    All other components within normal limits  URINALYSIS, ROUTINE W REFLEX MICROSCOPIC (NOT AT [facility]) - Abnormal; Notable for the following:    APPearance CLOUDY (*)    Nitrite POSITIVE (*)    Leukocytes, UA SMALL (*)    All other components within normal limits  URINE  MICROSCOPIC-ADD ON - Abnormal; Notable for the following:    Bacteria, UA MANY (*)    All other components within normal limits  URINE CULTURE  CBC WITH DIFFERENTIAL/PLATELET  LIPASE, BLOOD  I-STAT TROPOININ, ED  I-STAT CREATININE, ED    Imaging Review Ct Abdomen Pelvis W Contrast  05/01/2015  CLINICAL DATA:  Abdominal pain since Friday. Pain worsening this evening. Bilateral lower rib pain. Shallow inspirations. EXAM: CT ABDOMEN AND PELVIS WITH CONTRAST TECHNIQUE: Multidetector CT imaging of the abdomen and pelvis was performed using the standard protocol following bolus administration of intravenous contrast. CONTRAST:  156mL OMNIPAQUE IOHEXOL 300 MG/ML  SOLN COMPARISON:  None. FINDINGS: Atelectasis in the lung bases. Suggestion of mild pericholecystic edema. No stones identified. No significant bile duct dilatation. The liver, spleen, pancreas, adrenal glands, kidneys, inferior vena cava, and retroperitoneal lymph nodes are unremarkable. Calcification of abdominal aorta and major branch vessels without aneurysm. Stomach, small bowel, and colon are  not abnormally distended. Stool-filled colon without inflammatory wall thickening. No free air or free fluid in the abdomen. Pelvis: Appendix is normal. Bladder wall is not thickened. No free or loculated pelvic fluid collections. No pelvic mass or lymphadenopathy. Diverticulosis of the sigmoid colon without inflammatory changes. Visualization of the low pelvis is limited due to streak artifact from right hip arthroplasty. Degenerative changes throughout the spine. No destructive bone lesions. Mild spondylolisthesis at L5-S1. Lumbar scoliosis is probably degenerative. IMPRESSION: Mild pericholecystic edema may indicate cholecystitis although no stones are demonstrated. Stool-filled colon suggesting constipation. Diverticulosis in the sigmoid region without diverticulitis. Electronically Signed   By: Lucienne Capers M.D.   On: 05/01/2015 23:25   I have  personally reviewed and evaluated these images and lab results as part of my medical decision-making.   EKG Interpretation   Date/Time:  Sunday May 01 2015 21:05:55 EST Ventricular Rate:  86 PR Interval:  178 QRS Duration: 145 QT Interval:  430 QTC Calculation: 514 R Axis:   -5 Text Interpretation:  Sinus rhythm Ventricular premature complex Biatrial  enlargement Left bundle branch block Confirmed by Hazle Coca [phone]) on  05/01/2015 10:09:46 PM      MDM   Final diagnoses:  Generalized abdominal pain  UTI (lower urinary tract infection)    BP 147/95 mmHg  Pulse 84  Temp(Src) 97.5 F (36.4 C) (Oral)  Resp 25  SpO2 97%     Domenic Moras, PA-C 05/01/15 [place], MD 05/02/15 1501

## 2015-05-02 ENCOUNTER — Emergency Department (HOSPITAL_COMMUNITY): Payer: Medicare Other

## 2015-05-02 DIAGNOSIS — R1011 Right upper quadrant pain: Secondary | ICD-10-CM | POA: Diagnosis not present

## 2015-05-02 DIAGNOSIS — N390 Urinary tract infection, site not specified: Secondary | ICD-10-CM | POA: Diagnosis not present

## 2015-05-02 MED ORDER — ONDANSETRON 4 MG PO TBDP: 4.0000 mg | ORAL_TABLET | Freq: Three times a day (TID) | ORAL | Status: DC | PRN

## 2015-05-02 MED ORDER — ONDANSETRON 4 MG PO TBDP
4.0000 mg | ORAL_TABLET | Freq: Once | ORAL | Status: AC
  Administered 2015-05-02: 4 mg via ORAL
  Filled 2015-05-02: qty 1

## 2015-05-02 NOTE — ED Provider Notes (Signed)
Right upper quadrant ultrasound, normal.  Patient will be discharged home as planned by B. Rona Ravens PA, with a prescription for Phenergan to help control nausea  Junius Creamer, NP 05/02/15 0200  Junius Creamer, NP 05/02/15 [id number]  Quintella Reichert, MD 05/02/15 1501

## 2015-05-03 LAB — URINE CULTURE

## 2015-06-10 ENCOUNTER — Other Ambulatory Visit: Payer: Self-pay | Admitting: Internal Medicine

## 2015-08-03 NOTE — Telephone Encounter (Signed)
Yes, please proceed

## 2015-08-03 NOTE — Telephone Encounter (Signed)
Message sent to Gilmore [facility] to order Prolia for Pt.

## 2015-08-03 NOTE — Telephone Encounter (Signed)
Received Pharmacist, community, Pt's estimated Out-Of-Pocket Max for cost of Prolia is $0. Prolia Record ID: [id number]. Insurance verification sent for scanning. Pt has appt on 08/30/2015. Would you like to proceed w/ Prolia?

## 2015-08-09 ENCOUNTER — Telehealth: Payer: Self-pay | Admitting: Internal Medicine

## 2015-08-09 ENCOUNTER — Ambulatory Visit: Payer: Medicare Other | Admitting: Internal Medicine

## 2015-08-09 NOTE — Telephone Encounter (Signed)
Caller name: [name]   Relationship to patient:  Can be reached: [phone]  Reason for call: lvm at 8:15 cancelling appt today.

## 2015-08-09 NOTE — Telephone Encounter (Signed)
Noted  

## 2015-08-16 NOTE — Telephone Encounter (Signed)
Prolia in fridge

## 2015-08-16 NOTE — Telephone Encounter (Signed)
Pt has f/u appt on 08/30/2015.

## 2015-08-30 ENCOUNTER — Ambulatory Visit (INDEPENDENT_AMBULATORY_CARE_PROVIDER_SITE_OTHER): Payer: Medicare Other | Admitting: Internal Medicine

## 2015-08-30 ENCOUNTER — Encounter: Payer: Self-pay | Admitting: Internal Medicine

## 2015-08-30 VITALS — BP 120/58 | HR 84 | Temp 98.0°F | Ht 63.0 in | Wt 129.6 lb

## 2015-08-30 DIAGNOSIS — Z09 Encounter for follow-up examination after completed treatment for conditions other than malignant neoplasm: Secondary | ICD-10-CM

## 2015-08-30 DIAGNOSIS — L989 Disorder of the skin and subcutaneous tissue, unspecified: Secondary | ICD-10-CM | POA: Diagnosis not present

## 2015-08-30 DIAGNOSIS — J438 Other emphysema: Secondary | ICD-10-CM | POA: Diagnosis not present

## 2015-08-30 DIAGNOSIS — F0390 Unspecified dementia without behavioral disturbance: Secondary | ICD-10-CM | POA: Diagnosis not present

## 2015-08-30 DIAGNOSIS — M810 Age-related osteoporosis without current pathological fracture: Secondary | ICD-10-CM

## 2015-08-30 MED ORDER — CEFUROXIME AXETIL 500 MG PO TABS: 500.0000 mg | ORAL_TABLET | Freq: Two times a day (BID) | ORAL | Status: DC

## 2015-08-30 MED ORDER — DENOSUMAB 60 MG/ML ~~LOC~~ SOLN
60.0000 mg | Freq: Once | SUBCUTANEOUS | Status: AC
  Administered 2015-08-30: 60 mg via SUBCUTANEOUS

## 2015-08-30 MED ORDER — MUPIROCIN CALCIUM 2 % EX CREA: 1.0000 "application " | TOPICAL_CREAM | Freq: Two times a day (BID) | CUTANEOUS | Status: DC

## 2015-08-30 NOTE — Progress Notes (Signed)
Subjective:    Patient ID: Donna Flowers, female    DOB: 05-31-1928, 82 y.o.   MRN: [id number]  DOS:  08/30/2015 Type of visit - description : Routine visit  Interval history: Dementia: Stable, doing well. COPD: Currently with no symptoms, good compliance of medication on as needed basis    Review of Systems  denies any falls or injuries No behavioral issues No nausea, vomiting, diarrhea Past Medical History  Diagnosis Date  . COPD (chronic obstructive pulmonary disease) ([place])   . Osteoporosis   . Dementia   . Complication of anesthesia     lot of confusion    Past Surgical History  Procedure Laterality Date  . Joint replacement      right hip, left knee  . Ankle surgery      right    Social History   Social History  . Marital Status: Widowed    Spouse Name: N/A  . Number of Children: 2  . Years of Education: N/A   Occupational History  . elderly    Social History Main Topics  . Smoking status: Never Smoker   . Smokeless tobacco: Never Used  . Alcohol Use: No  . Drug Use: No  . Sexual Activity: Not on file   Other Topics Concern  . Not on file   Social History Narrative   Lives by herself.   Since admission 09-2013, has a sitter M-F  8-5 pm.   [name] (son) [phone]   [name] (lives in [place])                 Medication List       This list is accurate as of: 08/30/15  8:57 PM.  Always use your most recent med list.               acetaminophen 325 MG tablet  Commonly known as:  TYLENOL  Take 2 tablets (650 mg total) by mouth every 6 (six) hours as needed for mild pain (or Fever >/= 101).     albuterol 108 (90 Base) MCG/ACT inhaler  Commonly known as:  PROVENTIL HFA;VENTOLIN HFA  Inhale 2 puffs into the lungs every 6 (six) hours as needed for wheezing or shortness of breath.     budesonide-formoterol 80-4.5 MCG/ACT inhaler  Commonly known as:  SYMBICORT  Inhale 2 puffs into the lungs 2 (two) times daily.     calcium-vitamin D  500-200 MG-UNIT tablet  Commonly known as:  OSCAL WITH D  Take 1 tablet by mouth daily.     cefUROXime 500 MG tablet  Commonly known as:  CEFTIN  Take 1 tablet (500 mg total) by mouth 2 (two) times daily with a meal.     donepezil 10 MG tablet  Commonly known as:  ARICEPT  Take 0.5 tablets (5 mg total) by mouth 2 (two) times daily.     multivitamin with minerals tablet  Take 1 tablet by mouth daily.     mupirocin cream 2 %  Commonly known as:  BACTROBAN  Apply 1 application topically 2 (two) times daily.           Objective:   Physical Exam BP 120/58 mmHg  Pulse 84  Temp(Src) 98 F (36.7 C) (Oral)  Ht 5\' 3"  (1.6 m)  Wt 129 lb 9.6 oz (58.786 kg)  BMI 22.96 kg/m2  SpO2 96% General:   Well developed, well nourished . NAD.  HEENT:  Normocephalic . Face symmetric, atraumatic Lungs:  CTA  B Normal respiratory effort, no intercostal retractions, no accessory muscle use. Heart: RRR,  no murmur.  No pretibial edema bilaterally  Skin: see a/p Neurologic:  alert & recently demented, cooperative.   Psych-- Behavior appropriate. No anxious or depressed appearing.      Assessment & Plan:   Assessment > DJD, used to see [facility] COPD -- on meds prn Osteoporosis - s/p forteo before, last dexa 2013: osteoporosis, saw Dr Cruzita Lederer 2015 rec prolia   Urinary incontinence Dementia--- aricept started ~ 2011 Skin ca-- SCC, does not see derm regularly  Left leg edema: (-) [place] ~ 10-2014  Plan  COPD: Continue with medications as needed Allergic rhinitis: Flonase, Claritin prn Osteoporosis: Prolia today, repeat in 6 months ; continuing calcium and vitamin D.  Dementia: Stable. Skin lesion: A the  end of the visit, the patient's son mention he noted a skin lesion today, exam shows a quarter  size round, crusty lesion at the R shoulder, some erythema around, no discharge. DDX ecthyma versus BCC vs other skin cancer . The patient does not know for how long she has the lesion. Recommend  a trial with Bactroban, Ceftin. If not completely back to normal within 2 or 3 weeks will call for a dermatology referral.  RTC 4-6 months

## 2015-08-30 NOTE — Assessment & Plan Note (Signed)
COPD: Continue with medications as needed Allergic rhinitis: Flonase, Claritin prn Osteoporosis: Prolia today, repeat in 6 months ; continuing calcium and vitamin D.  Dementia: Stable. Skin lesion: A the  end of the visit, the patient's son mention he noted a skin lesion today, exam shows a quarter  size round, crusty lesion at the R shoulder, some erythema around, no discharge. DDX ecthyma versus BCC vs other skin cancer . The patient does not know for how long she has the lesion. Recommend a trial with Bactroban, Ceftin. If not completely back to normal within 2 or 3 weeks will call for a dermatology referral.  RTC 4-6 months

## 2015-08-30 NOTE — Progress Notes (Signed)
Pre visit review using our clinic review tool, if applicable. No additional management support is needed unless otherwise documented below in the visit note. 

## 2015-08-30 NOTE — Patient Instructions (Addendum)
GO TO THE FRONT DESK  Schedule your next appointment for a  Check up When?   4  to 6 months  Fasting? no   Allergies: Low dose of Claritin 5 mg one tablet daily as needed Flonase: 2 sprays in each side of the nose daily   Continue same medications Next Prolia ---> 6 months

## 2015-12-05 ENCOUNTER — Telehealth: Payer: Self-pay | Admitting: Internal Medicine

## 2015-12-05 DIAGNOSIS — N390 Urinary tract infection, site not specified: Secondary | ICD-10-CM

## 2015-12-05 DIAGNOSIS — M810 Age-related osteoporosis without current pathological fracture: Secondary | ICD-10-CM

## 2015-12-05 NOTE — Telephone Encounter (Signed)
Please advise 

## 2015-12-05 NOTE — Telephone Encounter (Signed)
Pt's son called in because he says that pt is having UTI symptoms. He says that pt's last on was in November. He would like to know if PCP could call in a Rx or if he would need to bring pt in to be seen?   Please advise.    Meriel FlavorsJenny ReichmannSon - [phone]

## 2015-12-05 NOTE — Telephone Encounter (Signed)
If she has severe symptoms, fever, chills needs to be seen If not and if is more convenient simply need to bring a sterile urine sample. Provided patient's son with a sterile container. Send a UA, urine culture, DX UTI.

## 2015-12-05 NOTE — Telephone Encounter (Signed)
Shiquita, please inform Pt's son to come by and pick up sterile container, okay to collect sample at home and bring back at his convenience for testing. Thank you.

## 2015-12-12 ENCOUNTER — Other Ambulatory Visit: Payer: Self-pay | Admitting: Internal Medicine

## 2015-12-12 NOTE — Telephone Encounter (Signed)
Refill sent per [facility] refill protocol/SLS  

## 2015-12-15 NOTE — Telephone Encounter (Addendum)
Pt due for Prolia in 02/2016. Overdue for repeat bone density. Order placed. Prolia re-verification completed online. Verification form sent for scanning.

## 2016-01-04 NOTE — Telephone Encounter (Signed)
Received Prolia eligibility check for Prolia. Estimated out of pocket cost is $0. Eligibility check list sent for scanning. Prolia in fridge. F/U appt scheduled for 03/07/2016 at 1000.

## 2016-02-15 ENCOUNTER — Ambulatory Visit (HOSPITAL_BASED_OUTPATIENT_CLINIC_OR_DEPARTMENT_OTHER): Payer: Medicare Other

## 2016-02-15 ENCOUNTER — Ambulatory Visit (INDEPENDENT_AMBULATORY_CARE_PROVIDER_SITE_OTHER): Payer: Medicare Other | Admitting: Family Medicine

## 2016-02-15 VITALS — BP 150/72 | HR 80 | Temp 97.7°F | Wt 132.6 lb

## 2016-02-15 DIAGNOSIS — T148XXA Other injury of unspecified body region, initial encounter: Secondary | ICD-10-CM

## 2016-02-15 DIAGNOSIS — T148 Other injury of unspecified body region: Secondary | ICD-10-CM | POA: Diagnosis not present

## 2016-02-15 DIAGNOSIS — M7989 Other specified soft tissue disorders: Secondary | ICD-10-CM | POA: Diagnosis not present

## 2016-02-15 DIAGNOSIS — S81802A Unspecified open wound, left lower leg, initial encounter: Secondary | ICD-10-CM

## 2016-02-15 MED ORDER — MUPIROCIN CALCIUM 2 % EX CREA: 1.0000 "application " | TOPICAL_CREAM | Freq: Two times a day (BID) | CUTANEOUS | 0 refills | Status: DC

## 2016-02-15 NOTE — Patient Instructions (Addendum)
Wound Care Taking care of your wound properly can help to prevent pain and infection. It can also help your wound to heal more quickly.  HOW TO CARE FOR YOUR WOUND  Take or apply over-the-counter and prescription medicines only as told by your health care provider.  If you were prescribed antibiotic medicine, take or apply it as told by your health care provider. Do not stop using the antibiotic even if your condition improves.  Clean the wound each day or as told by your health care provider.  Wash the wound with mild soap and water.  Rinse the wound with water to remove all soap.  Pat the wound dry with a clean towel. Do not rub it.  There are many different ways to close and cover a wound. For example, a wound can be covered with stitches (sutures), skin glue, or adhesive strips. Follow instructions from your health care provider about:  How to take care of your wound.  When and how you should change your bandage (dressing).  When you should remove your dressing.  Removing whatever was used to close your wound.  Check your wound every day for signs of infection. Watch for:  Redness, swelling, or pain.  Fluid, blood, or pus.  Keep the dressing dry until your health care provider says it can be removed. Do not take baths, swim, use a hot tub, or do anything that would put your wound underwater until your health care provider approves.  Raise (elevate) the injured area above the level of your heart while you are sitting or lying down.  Do not scratch or pick at the wound.  Keep all follow-up visits as told by your health care provider. This is important. SEEK MEDICAL CARE IF:  You received a tetanus shot and you have swelling, severe pain, redness, or bleeding at the injection site.  You have a fever.  Your pain is not controlled with medicine.  You have increased redness, swelling, or pain at the site of your wound.  You have fluid, blood, or pus coming from your  wound.  You notice a bad smell coming from your wound or your dressing. SEEK IMMEDIATE MEDICAL CARE IF:  You have a red streak going away from your wound.   This information is not intended to replace advice given to you by your health care provider. Make sure you discuss any questions you have with your health care provider.   Document Released: 03/06/2008 Document Revised: 10/12/2014 Document Reviewed: 05/24/2014 Elsevier Interactive Patient Education 2016 Elsevier Inc.  21mm x 19mm

## 2016-02-15 NOTE — Progress Notes (Signed)
Pre visit review using our clinic review tool, if applicable. No additional management support is needed unless otherwise documented below in the visit note. 

## 2016-02-15 NOTE — Progress Notes (Signed)
Chief Complaint  Patient presents with  . Leg Swelling    left leg edema, with redness. On going for about 1 week. Red scracth on the front left leg    Subjective: Patient is a 82 y.o. female here for left leg swelling. Pt has a hx of dementia and her son provides the vast majority of the hx.   Duration: 1 week- she resides at home and son was called by sibling about her leg swelling Associated symptoms- calf pain Any recent injury, travel, surgery or prolonged bed rest? No Personal or famhx of clot or bleeding disorder? No She also has been picking at skin on leg. She has an ulcer now that is not draining. No current tx. She states she is not picking at it, but her son states she is.  ROS: MSK- + leg swelling Lungs- denies SOB  Family History  Problem Relation Age of Onset  . Colon cancer Neg Hx   . Breast cancer Neg Hx   . Brain cancer Father    Past Medical History:  Diagnosis Date  . Complication of anesthesia    lot of confusion  . COPD (chronic obstructive pulmonary disease) ([place])   . Dementia   . Osteoporosis    No Known Allergies  Current Outpatient Prescriptions:  .  acetaminophen (TYLENOL) 325 MG tablet, Take 2 tablets (650 mg total) by mouth every 6 (six) hours as needed for mild pain (or Fever >/= 101)., Disp: , Rfl:  .  albuterol (PROVENTIL HFA;VENTOLIN HFA) 108 (90 BASE) MCG/ACT inhaler, Inhale 2 puffs into the lungs every 6 (six) hours as needed for wheezing or shortness of breath., Disp: 1 Inhaler, Rfl: 2 .  budesonide-formoterol (SYMBICORT) 80-4.5 MCG/ACT inhaler, Inhale 2 puffs into the lungs 2 (two) times daily., Disp: 1 Inhaler, Rfl: 12 .  calcium-vitamin D (OSCAL WITH D) 500-200 MG-UNIT per tablet, Take 1 tablet by mouth daily., Disp: , Rfl:  .  donepezil (ARICEPT) 10 MG tablet, TAKE 0.5 TABLETS (5 MG TOTAL) BY MOUTH 2 (TWO) TIMES DAILY., Disp: 90 tablet, Rfl: 1 .  Multiple Vitamins-Minerals (MULTIVITAMIN WITH MINERALS) tablet, Take 1 tablet by mouth  daily., Disp: , Rfl:  .  mupirocin cream (BACTROBAN) 2 %, Apply 1 application topically 2 (two) times daily., Disp: 15 g, Rfl: 0  Objective: BP (!) 150/72 (BP Location: Right Arm, Patient Position: Sitting, Cuff Size: Normal)   Pulse 80   Temp 97.7 F (36.5 C) (Oral)   Wt 132 lb 9.6 oz (60.1 kg)   SpO2 97%   BMI 23.49 kg/m  General: Awake, appears stated age HEENT: MMM, EOMi  Heart: RRR, no murmurs, 2+ pitting edema on L, 1+ pitting edema on L Lungs: CTAB, no rales, wheezes or rhonchi. Normal effort, no accessory muscle use MSK: +L calf TTP, negative Homan's, L leg is visibly larger in diameter than the R Skin: there is a 7x11 mm vertically longitudinal ulcer on the distal 1/3 of the anterior L leg. There is surrounding concentric erythema without significant warmth. There are also areas of erythema and excoriation just superior to the L ankle. I do not appreciate any drainage or signs of infection. Psych: Limited judgment and insight, normal affect and mood, poor memory  Assessment and Plan: Leg swelling - Plan: US Venous Img Lower Unilateral Left  Excoriation of periwound skin - Plan: mupirocin cream (BACTROBAN) 2 %  Wound of left leg, initial encounter  Orders as above. Tried to get study done today, but son is  unable to do the time available. Hopefully will be done in the next day or two.  For her wounds, I want her to stop picking at the area in question. The measurements were provided to her son as well. If it grows or the redness starts to spread out, I want her seen sooner than her initially scheduled appt.  Given age and co-morbidities, I am OK with her BP. F/u 2 weeks with Dr. Larose Kells, me if there are no openings. If worsening or failing to improve, will refer to wound clinic.  The patient voiced understanding and agreement to the plan.  C[place]by [place]

## 2016-02-16 ENCOUNTER — Ambulatory Visit (HOSPITAL_BASED_OUTPATIENT_CLINIC_OR_DEPARTMENT_OTHER)
Admission: RE | Admit: 2016-02-16 | Discharge: 2016-02-16 | Disposition: A | Discharge status: Home / Self Care | Payer: Medicare Other | Source: Ambulatory Visit | Attending: Family Medicine | Admitting: Family Medicine

## 2016-02-16 DIAGNOSIS — M7989 Other specified soft tissue disorders: Secondary | ICD-10-CM | POA: Diagnosis not present

## 2016-02-29 ENCOUNTER — Encounter: Payer: Self-pay | Admitting: Internal Medicine

## 2016-02-29 ENCOUNTER — Ambulatory Visit (INDEPENDENT_AMBULATORY_CARE_PROVIDER_SITE_OTHER): Payer: Medicare Other | Admitting: Internal Medicine

## 2016-02-29 VITALS — BP 122/62 | HR 96 | Temp 98.1°F | Resp 14 | Ht 63.0 in | Wt 137.1 lb

## 2016-02-29 DIAGNOSIS — M810 Age-related osteoporosis without current pathological fracture: Secondary | ICD-10-CM | POA: Diagnosis not present

## 2016-02-29 DIAGNOSIS — L989 Disorder of the skin and subcutaneous tissue, unspecified: Secondary | ICD-10-CM

## 2016-02-29 DIAGNOSIS — L03116 Cellulitis of left lower limb: Secondary | ICD-10-CM

## 2016-02-29 MED ORDER — DENOSUMAB 60 MG/ML ~~LOC~~ SOLN
60.0000 mg | Freq: Once | SUBCUTANEOUS | Status: AC
Start: 2016-02-29 — End: 2016-02-29
  Administered 2016-02-29: 60 mg via SUBCUTANEOUS

## 2016-02-29 MED ORDER — DOXYCYCLINE HYCLATE 100 MG PO TABS: 100.0000 mg | ORAL_TABLET | Freq: Two times a day (BID) | ORAL | 0 refills | Status: DC

## 2016-02-29 NOTE — Progress Notes (Signed)
Subjective:    Patient ID: Donna Flowers, female    DOB: 05/01/1928, 82 y.o.   MRN: [id number]  DOS:  02/29/2016 Type of visit - description : ROV, here w/ her son  Interval history: Osteoporosis: Due for a  prolia a injection Seen 02/15/2016 with leg swelling, [place] negative for DVT , also had a excoriation, was prescribed topical antibiotics, better?. Swelling is about the same.  Had a chronic skin lesion at the right shoulder few months ago, they used topical antibiotics, area looks about the same. Dementia: Still lives by herself, does not seem to be loosing ground according to the son.  Review of Systems Denies fever chills Admits to couhg for the last 2-3 days without fever, chills or sputum production. No recent falls.   Past Medical History:  Diagnosis Date  . Complication of anesthesia    lot of confusion  . COPD (chronic obstructive pulmonary disease) ([place])   . Dementia   . Osteoporosis     Past Surgical History:  Procedure Laterality Date  . ANKLE SURGERY     right  . JOINT REPLACEMENT     right hip, left knee    Social History   Social History  . Marital status: Widowed    Spouse name: N/A  . Number of children: 2  . Years of education: N/A   Occupational History  . elderly    Social History Main Topics  . Smoking status: Never Smoker  . Smokeless tobacco: Never Used  . Alcohol use No  . Drug use: No  . Sexual activity: Not on file   Other Topics Concern  . Not on file   Social History Narrative   Lives by herself.   Since admission 09-2013, has a sitter M-F  8-5 pm.   [name] (son) [phone]   [name] (lives in [place])                 Medication List       Accurate as of 02/29/16  2:47 PM. Always use your most recent med list.          acetaminophen 325 MG tablet Commonly known as:  TYLENOL Take 2 tablets (650 mg total) by mouth every 6 (six) hours as needed for mild pain (or Fever >/= 101).   albuterol 108 (90 Base) MCG/ACT  inhaler Commonly known as:  PROVENTIL HFA;VENTOLIN HFA Inhale 2 puffs into the lungs every 6 (six) hours as needed for wheezing or shortness of breath.   budesonide-formoterol 80-4.5 MCG/ACT inhaler Commonly known as:  SYMBICORT Inhale 2 puffs into the lungs 2 (two) times daily.   calcium-vitamin D 500-200 MG-UNIT tablet Commonly known as:  OSCAL WITH D Take 1 tablet by mouth daily.   denosumab 60 MG/ML Soln injection Commonly known as:  PROLIA Inject 60 mg into the skin every 6 (six) months. Administer in upper arm, thigh, or abdomen   donepezil 10 MG tablet Commonly known as:  ARICEPT TAKE 0.5 TABLETS (5 MG TOTAL) BY MOUTH 2 (TWO) TIMES DAILY.   multivitamin with minerals tablet Take 1 tablet by mouth daily.   mupirocin cream 2 % Commonly known as:  BACTROBAN Apply 1 application topically 2 (two) times daily.          Objective:   Physical Exam BP 122/62 (BP Location: Left Arm, Patient Position: Sitting, Cuff Size: Small)   Pulse 96   Temp 98.1 F (36.7 C) (Oral)   Resp 14   Ht  5\' 3"  (1.6 m)   Wt 137 lb 2 oz (62.2 kg)   SpO2 96%   BMI 24.29 kg/m  General:   Well developed, well nourished . NAD.  HEENT:  Normocephalic . Face symmetric, atraumatic Lungs:  Rhonchi with cough but no wheezing Normal respiratory effort, no intercostal retractions, no accessory muscle use. Heart: RRR,  no murmur.  No pretibial edema bilaterally  Skin:  --Right shoulder: Has a lesion w/  thick crust, removed, has a  1 cm, round, friable skin lesion. See pictures --Lower extremity:At the left pretibial area she has a couple of excoriations, some redness. No discharge, no TTP. See picture Left calf: Continue to be swollen, 2.5 cm larger in diameter compared to the left. Neurologic:  Recently demented, gait appropriate for age , at baseline Psych--  Cooperative  No anxious or depressed appearing.             Assessment & Plan:   Assessment  DJD, used to see [facility]  ortho COPD -- on meds prn Osteoporosis - s/p forteo before, last dexa 2013: osteoporosis, saw Dr Cruzita Lederer 2015 rec prolia   Urinary incontinence Dementia--- aricept started ~ 2011 Skin ca-- SCC, does not see derm regularly  Left leg edema: (-) [place] ~ 10-2014  PLAN: Osteoporosis: Prolia today Cellulitis, left leg: Patient cont  redness and swelling at the left pretibial area, leg is swollen, [place] negative for DVT. Recommend doxy for 10 days. Leg elevation. Skin lesion, right shoulder: Cx sent, needs to see dermatology, referral , malignancy?  RTC 2 weeks to f/u cellulitis . Flu shot on RTC

## 2016-02-29 NOTE — Progress Notes (Signed)
Pre visit review using our clinic review tool, if applicable. No additional management support is needed unless otherwise documented below in the visit note. 

## 2016-02-29 NOTE — Patient Instructions (Addendum)
  GO TO THE FRONT DESK Schedule your next appointment for a  Check up in 2 weeks  Leg elevation Doxycycline for 10 days   Needs to see the dermatologist as soon as possible regards the skin lesion at the right shoulder. Tried to keep the area clean and dry and covered with a Band-Aid

## 2016-03-01 NOTE — Assessment & Plan Note (Signed)
Osteoporosis: Prolia today Cellulitis, left leg: Patient cont  redness and swelling at the left pretibial area, leg is swollen, [place] negative for DVT. Recommend doxy for 10 days. Leg elevation. Skin lesion, right shoulder: Cx sent, needs to see dermatology, referral , malignancy?  RTC 2 weeks to f/u cellulitis . Flu shot on RTC

## 2016-03-03 LAB — WOUND CULTURE
Gram Stain: NONE SEEN
Gram Stain: NONE SEEN

## 2016-03-07 ENCOUNTER — Encounter: Payer: PRIVATE HEALTH INSURANCE | Admitting: Internal Medicine

## 2016-03-08 ENCOUNTER — Ambulatory Visit (HOSPITAL_BASED_OUTPATIENT_CLINIC_OR_DEPARTMENT_OTHER)
Admission: RE | Admit: 2016-03-08 | Discharge: 2016-03-08 | Disposition: A | Discharge status: Home / Self Care | Payer: Medicare Other | Source: Ambulatory Visit | Attending: Internal Medicine | Admitting: Internal Medicine

## 2016-03-08 DIAGNOSIS — M810 Age-related osteoporosis without current pathological fracture: Secondary | ICD-10-CM | POA: Insufficient documentation

## 2016-03-08 DIAGNOSIS — Z96641 Presence of right artificial hip joint: Secondary | ICD-10-CM | POA: Insufficient documentation

## 2016-03-08 DIAGNOSIS — Z780 Asymptomatic menopausal state: Secondary | ICD-10-CM | POA: Insufficient documentation

## 2016-03-08 DIAGNOSIS — F0390 Unspecified dementia without behavioral disturbance: Secondary | ICD-10-CM | POA: Insufficient documentation

## 2016-03-08 DIAGNOSIS — M85831 Other specified disorders of bone density and structure, right forearm: Secondary | ICD-10-CM | POA: Diagnosis not present

## 2016-03-13 ENCOUNTER — Encounter: Payer: Self-pay | Admitting: Internal Medicine

## 2016-03-13 ENCOUNTER — Ambulatory Visit (INDEPENDENT_AMBULATORY_CARE_PROVIDER_SITE_OTHER): Payer: Medicare Other | Admitting: Internal Medicine

## 2016-03-13 VITALS — BP 134/68 | HR 84 | Temp 97.7°F | Ht 63.0 in | Wt 136.4 lb

## 2016-03-13 DIAGNOSIS — M810 Age-related osteoporosis without current pathological fracture: Secondary | ICD-10-CM

## 2016-03-13 DIAGNOSIS — T148XXA Other injury of unspecified body region, initial encounter: Secondary | ICD-10-CM

## 2016-03-13 DIAGNOSIS — L03116 Cellulitis of left lower limb: Secondary | ICD-10-CM | POA: Diagnosis not present

## 2016-03-13 DIAGNOSIS — Z23 Encounter for immunization: Secondary | ICD-10-CM | POA: Diagnosis not present

## 2016-03-13 MED ORDER — HYDROCORTISONE 2.5 % EX CREA: TOPICAL_CREAM | Freq: Two times a day (BID) | CUTANEOUS | 2 refills | Status: AC | PRN

## 2016-03-13 MED ORDER — MUPIROCIN CALCIUM 2 % EX CREA: 1.0000 "application " | TOPICAL_CREAM | Freq: Two times a day (BID) | CUTANEOUS | 1 refills | Status: DC

## 2016-03-13 NOTE — Progress Notes (Signed)
Pre visit review using our clinic review tool, if applicable. No additional management support is needed unless otherwise documented below in the visit note. 

## 2016-03-13 NOTE — Patient Instructions (Signed)
GO TO THE FRONT DESK Schedule your next appointment for a  routine checkup in 4 months.  ----------------------  Try to keep the legs elevated to prevent swelling  Apply Bactroban if you have superficial sores in the leg  Apply hydrocortisone 2.5% as needed for  mild redness or dryness  Call anytime if you have worsening swelling, the leg looks infected (hot, more red, you have fever chills)

## 2016-03-13 NOTE — Progress Notes (Signed)
Subjective:    Patient ID: Donna Flowers, female    DOB: 1928-02-06, 82 y.o.   MRN: [id number]  DOS:  03/13/2016 Type of visit - description : Follow-up, here w/ her son Interval history: Was dx  with cellulitis, status post antibiotics, leg looks much improved.    Review of Systems No fever or chills No nausea, vomiting, diarrhea Have some cough, that also improved  Past Medical History:  Diagnosis Date  . Complication of anesthesia    lot of confusion  . COPD (chronic obstructive pulmonary disease) ([place])   . Dementia   . Osteoporosis     Past Surgical History:  Procedure Laterality Date  . ANKLE SURGERY     right  . JOINT REPLACEMENT     right hip, left knee    Social History   Social History  . Marital status: Widowed    Spouse name: N/A  . Number of children: 2  . Years of education: N/A   Occupational History  . elderly    Social History Main Topics  . Smoking status: Never Smoker  . Smokeless tobacco: Never Used  . Alcohol use No  . Drug use: No  . Sexual activity: Not on file   Other Topics Concern  . Not on file   Social History Narrative   Lives by herself.   Since admission 09-2013, has a sitter M-F  8-5 pm.   Donna Flowers (son) [phone]   Donna Flowers (lives in [place])                 Medication List       Accurate as of 03/13/16  3:59 PM. Always use your most recent med list.          acetaminophen 325 MG tablet Commonly known as:  TYLENOL Take 2 tablets (650 mg total) by mouth every 6 (six) hours as needed for mild pain (or Fever >/= 101).   albuterol 108 (90 Base) MCG/ACT inhaler Commonly known as:  PROVENTIL HFA;VENTOLIN HFA Inhale 2 puffs into the lungs every 6 (six) hours as needed for wheezing or shortness of breath.   budesonide-formoterol 80-4.5 MCG/ACT inhaler Commonly known as:  SYMBICORT Inhale 2 puffs into the lungs 2 (two) times daily.   calcium-vitamin D 500-200 MG-UNIT tablet Commonly known as:  OSCAL WITH D Take  1 tablet by mouth daily.   denosumab 60 MG/ML Soln injection Commonly known as:  PROLIA Inject 60 mg into the skin every 6 (six) months. Administer in upper arm, thigh, or abdomen   donepezil 10 MG tablet Commonly known as:  ARICEPT TAKE 0.5 TABLETS (5 MG TOTAL) BY MOUTH 2 (TWO) TIMES DAILY.   multivitamin with minerals tablet Take 1 tablet by mouth daily.   mupirocin cream 2 % Commonly known as:  BACTROBAN Apply 1 application topically 2 (two) times daily.          Objective:   Physical Exam BP 134/68 (BP Location: Right Arm, Patient Position: Sitting, Cuff Size: Normal)   Pulse 84   Temp 97.7 F (36.5 C) (Oral)   Ht 5\' 3"  (1.6 m)   Wt 136 lb 6.4 oz (61.9 kg)   SpO2 95%   BMI 24.16 kg/m  General:   Well developed, well nourished . NAD.  HEENT:  Normocephalic . Face symmetric, atraumatic  Skin:  Left pretibial area: shallow ulcer seen at the last visit is closing up and much more superficial. Minimal redness without warmness around it. Skin in  general is still dry. Edema on both legs significantly improve Neurologic:  alert & oriented X3.  Speech normal, gait appropriate for age and unassisted Psych--  Cognition and judgment appear intact.  Cooperative with normal attention span and concentration.  Behavior appropriate. No anxious or depressed appearing.      Assessment & Plan:   Assessment  DJD, used to see [facility] COPD -- on meds prn Osteoporosis - s/p forteo before, saw Dr Cruzita Lederer 2015 rec prolia   T score 2013 -3.6, forearm, ; -3.2 femur (Lunar prodigy advance) T score 02-2016 -3.9, forearm, -3.2 femur (Lunar iDXA)  (matching is different, difficult to compare results). Urinary incontinence Dementia--- aricept started ~ 2011 Skin ca-- SCC, does not see derm regularly  Left leg edema: (-) [place] ~ 10-2014  PLAN: Cellulitis, left leg: Much improved. Recommend to keep legs elevated, use Bactroban and hydrocortisone cream as needed.. Skin lesion, pending  visit to dermatology Osteoporosis: Last DEXA results discussed w/ pt's son. Next prolia 08-2016 Flu shot today RTC 4 months

## 2016-03-14 NOTE — Assessment & Plan Note (Signed)
Cellulitis, left leg: Much improved. Recommend to keep legs elevated, use Bactroban and hydrocortisone cream as needed.. Skin lesion, pending visit to dermatology Osteoporosis: Last DEXA results discussed. Next prolia 08-2016 Flu shot today RTC 4 months

## 2016-03-21 DIAGNOSIS — C44612 Basal cell carcinoma of skin of right upper limb, including shoulder: Secondary | ICD-10-CM | POA: Diagnosis not present

## 2016-03-21 DIAGNOSIS — C44519 Basal cell carcinoma of skin of other part of trunk: Secondary | ICD-10-CM | POA: Diagnosis not present

## 2016-05-18 ENCOUNTER — Telehealth: Payer: Self-pay | Admitting: Internal Medicine

## 2016-05-18 NOTE — Telephone Encounter (Signed)
Called Donna Flowers to schedule annual wellness appointment. Patient did not answer. Will attempt to call patient again.

## 2016-05-23 ENCOUNTER — Telehealth: Payer: Self-pay | Admitting: *Deleted

## 2016-05-23 NOTE — Telephone Encounter (Signed)
Appointment sched 12/20 at 4pm

## 2016-05-29 NOTE — Progress Notes (Addendum)
Subjective:   Donna Flowers is a 82 y.o. female who presents for Medicare Annual (Subsequent) preventive examination.  Review of Systems:  No ROS.  Medicare Wellness Visit.  Cardiac Risk Factors include: advanced age (>22men, >61 women) Sleep patterns: Reports sleeping well. Sleep about 10 hrs/night per son.  Home Safety/Smoke Alarms:  Smoke detectors in place. Living environment; residence and Firearm Safety: [employer] 8-5 M-F. Son visits on the weekends. 1 story home. No guns. Feels safe.  Seat Belt Safety/Bike Helmet: Wears seatbelt.   Counseling:   Eye Exam- Wears glasses at all times. Can't recall eye doctor. Reports following annunally. Dental- Dr.Saunders as needed.  Female:   Pap- n/a       Mammo- Last 12/04/04: Negative - BI-RADS 1      Dexa scan- Last 03/08/16:  Osteoporosis. F/u 10yrs per report. CCS- Last 07/04/2007. Diverticulosis. No f/u on file.       Objective:     Vitals: BP (!) 142/62 (BP Location: Right Arm, Patient Position: Sitting, Cuff Size: Normal)   Pulse 68   Ht 5\' 3"  (1.6 m)   Wt 140 lb 3.2 oz (63.6 kg)   SpO2 96%   BMI 24.84 kg/m   Body mass index is 24.84 kg/m.   Tobacco History  Smoking Status  . Never Smoker  Smokeless Tobacco  . Never Used     Counseling given: No   Past Medical History:  Diagnosis Date  . Complication of anesthesia    lot of confusion  . COPD (chronic obstructive pulmonary disease) ([place])   . Dementia   . Osteoporosis    Past Surgical History:  Procedure Laterality Date  . ANKLE SURGERY     right  . JOINT REPLACEMENT     right hip, left knee   Family History  Problem Relation Age of Onset  . Brain cancer Father   . Colon cancer Neg Hx   . Breast cancer Neg Hx    History  Sexual Activity  . Sexual activity: Not on file    Outpatient Encounter Prescriptions as of 05/30/2016  Medication Sig  . acetaminophen (TYLENOL) 325 MG tablet Take 2 tablets (650 mg total) by mouth every 6 (six) hours as  needed for mild pain (or Fever >/= 101).  . calcium-vitamin D (OSCAL WITH D) 500-200 MG-UNIT per tablet Take 1 tablet by mouth daily.  Marland Kitchen denosumab (PROLIA) 60 MG/ML SOLN injection Inject 60 mg into the skin every 6 (six) months. Administer in upper arm, thigh, or abdomen  . donepezil (ARICEPT) 10 MG tablet TAKE 0.5 TABLETS (5 MG TOTAL) BY MOUTH 2 (TWO) TIMES DAILY.  . hydrocortisone 2.5 % cream Apply topically 2 (two) times daily as needed.  . Multiple Vitamins-Minerals (MULTIVITAMIN WITH MINERALS) tablet Take 1 tablet by mouth daily.  . mupirocin cream (BACTROBAN) 2 % Apply 1 application topically 2 (two) times daily.  . [DISCONTINUED] albuterol (PROVENTIL HFA;VENTOLIN HFA) 108 (90 BASE) MCG/ACT inhaler Inhale 2 puffs into the lungs every 6 (six) hours as needed for wheezing or shortness of breath.  . [DISCONTINUED] budesonide-formoterol (SYMBICORT) 80-4.5 MCG/ACT inhaler Inhale 2 puffs into the lungs 2 (two) times daily.   No facility-administered encounter medications on file as of 05/30/2016.     Activities of Daily Living In your present state of health, do you have any difficulty performing the following activities: 05/30/2016 08/30/2015  Hearing? N N  Vision? N Y  Difficulty concentrating or making decisions? N Y  Walking or climbing stairs?  Y N  Dressing or bathing? Y Y  Doing errands, shopping? Donna Flowers  Preparing Food and eating ? Y -  Using the Toilet? N -  In the past six months, have you accidently leaked urine? Y -  Do you have problems with loss of bowel control? N -  Managing your Medications? Y -  Managing your Finances? Y -  Housekeeping or managing your Housekeeping? Y -  Some recent data might be hidden    Patient Care Team: Colon Branch, MD as PCP - General    Assessment:    No ROS.  Medicare Wellness Visit.  Exercise Activities and Dietary recommendations Current Exercise Habits: The patient does not participate in regular exercise at present, Exercise limited by:  orthopedic condition(s)   Diet 24 HOUR RECALL: Breakfast: Cereal and banana. Lunch: sandwich and fruit Dinner: Soups or pastas with vegetables Drinks plenty of water.  Goals    None     Fall Risk Fall Risk  05/30/2016 08/30/2015 03/02/2015 10/19/2014 03/04/2013  Falls in the past year? No No No No No   Depression Screen PHQ 2/9 Scores 05/30/2016 08/30/2015 03/02/2015 10/19/2014  PHQ - 2 Score 0 0 0 0  Exception Documentation - Patient refusal Other- indicate reason in comment box -  Not completed - - DEMENTIA -     Cognitive Function MMSE - Mini Mental State Exam 05/30/2016  Not completed: Unable to complete  Orientation to time 1  Orientation to Place 1  Registration 3  Attention/ Calculation 0  Recall 0  Copy design 0        Immunization History  Administered Date(s) Administered  . Influenza Split 04/15/2012  . Influenza Whole 03/28/2007  . Influenza, High Dose Seasonal PF 03/13/2016  . Pneumococcal Conjugate-13 10/19/2014  . Pneumococcal Polysaccharide-23 04/15/2012  . Td 06/10/2007   Screening Tests Health Maintenance  Topic Date Due  . ZOSTAVAX  04/22/1990  . TETANUS/TDAP  06/09/2017  . DEXA SCAN  03/08/2018  . INFLUENZA VACCINE  Completed  . PNA vac Low Risk Adult  Completed      Plan:     Bring a copy of your advance directives to your next office visit. Continue to eat heart healthy diet (full of fruits, vegetables, whole grains, lean protein, water--limit salt, fat, and sugar intake) and increase physical activity as tolerated.   During the course of the visit the patient was educated and counseled about the following appropriate screening and preventive services:   Vaccines to include Pneumoccal, Influenza, Hepatitis B, Td, Zostavax, HCV  Cardiovascular Disease  Colorectal cancer screening  Bone density screening  Diabetes screening  Glaucoma screening  Mammography/PAP  Nutrition counseling   Patient Instructions (the written plan)  was given to the patient.   Naaman Plummer [place], [place]  05/30/2016  Kathlene November, MD

## 2016-05-29 NOTE — Progress Notes (Signed)
Pre visit review using our clinic review tool, if applicable. No additional management support is needed unless otherwise documented below in the visit note. 

## 2016-05-30 ENCOUNTER — Encounter: Payer: Self-pay | Admitting: *Deleted

## 2016-05-30 ENCOUNTER — Ambulatory Visit (INDEPENDENT_AMBULATORY_CARE_PROVIDER_SITE_OTHER): Payer: Medicare Other | Admitting: *Deleted

## 2016-05-30 VITALS — BP 142/62 | HR 68 | Ht 63.0 in | Wt 140.2 lb

## 2016-05-30 DIAGNOSIS — Z0000 Encounter for general adult medical examination without abnormal findings: Secondary | ICD-10-CM | POA: Diagnosis not present

## 2016-05-30 NOTE — Patient Instructions (Signed)
  Bring a copy of your advance directives to your next office visit. Continue to eat heart healthy diet (full of fruits, vegetables, whole grains, lean protein, water--limit salt, fat, and sugar intake) and increase physical activity as tolerated.

## 2016-05-31 ENCOUNTER — Telehealth: Payer: Self-pay

## 2016-05-31 NOTE — Telephone Encounter (Signed)
Form completed. LMOM informing Pt's son, [name] that form has been completed and placed at front desk for pick up at his convenience. Copy of form sent for scanning.

## 2016-06-10 ENCOUNTER — Other Ambulatory Visit: Payer: Self-pay | Admitting: Internal Medicine

## 2016-07-18 ENCOUNTER — Ambulatory Visit (INDEPENDENT_AMBULATORY_CARE_PROVIDER_SITE_OTHER): Payer: Medicare Other | Admitting: Internal Medicine

## 2016-07-18 ENCOUNTER — Encounter: Payer: Self-pay | Admitting: Internal Medicine

## 2016-07-18 VITALS — BP 122/70 | HR 81 | Temp 97.5°F | Resp 12 | Ht 63.0 in | Wt 134.1 lb

## 2016-07-18 DIAGNOSIS — R509 Fever, unspecified: Secondary | ICD-10-CM | POA: Diagnosis not present

## 2016-07-18 DIAGNOSIS — R5383 Other fatigue: Secondary | ICD-10-CM | POA: Diagnosis not present

## 2016-07-18 DIAGNOSIS — J410 Simple chronic bronchitis: Secondary | ICD-10-CM

## 2016-07-18 DIAGNOSIS — J40 Bronchitis, not specified as acute or chronic: Secondary | ICD-10-CM | POA: Diagnosis not present

## 2016-07-18 LAB — POCT INFLUENZA A: RAPID INFLUENZA A AGN: NEGATIVE

## 2016-07-18 MED ORDER — AZITHROMYCIN 250 MG PO TABS: ORAL_TABLET | ORAL | 0 refills | Status: DC

## 2016-07-18 NOTE — Progress Notes (Signed)
Subjective:    Patient ID: Donna Flowers, female    DOB: 1927-06-20, 83 y.o.   MRN: [id number]  DOS:  07/18/2016 Type of visit - description : rov Interval history: Here for a routine checkup, her son is with her. In general is doing well until yesterday when the care giver noted her to be more quiet and withdrawn the usual. John went to check on her last night, she felt warm.   Today, she seems more interactive but is not back to baseline.  The patient has advanced dementia but has not report any issues.   Review of Systems The family has not noted any chills, probably low-grade fever yesterday. See history of present illness Appetite decreased No nausea or vomiting, no constipation. She has on and off chronic cough, slightly worse in the last 24/36 hours. Very rarely she is able to bring up sputum.  Past Medical History:  Diagnosis Date  . Complication of anesthesia    lot of confusion  . COPD (chronic obstructive pulmonary disease) ([place])   . Dementia   . Osteoporosis     Past Surgical History:  Procedure Laterality Date  . ANKLE SURGERY     right  . JOINT REPLACEMENT     right hip, left knee    Social History   Social History  . Marital status: Widowed    Spouse name: N/A  . Number of children: 2  . Years of education: N/A   Occupational History  . elderly    Social History Main Topics  . Smoking status: Never Smoker  . Smokeless tobacco: Never Used  . Alcohol use No  . Drug use: No  . Sexual activity: Not on file   Other Topics Concern  . Not on file   Social History Narrative   Lives by herself.   Since admission 09-2013, has a sitter M-F  8-5 pm. Son visit after 5 pm till she goes to sleep   [name] (son) [phone]   [name] (lives in [place])               Allergies as of 07/18/2016   No Known Allergies     Medication List       Accurate as of 07/18/16 11:59 PM. Always use your most recent med list.          acetaminophen 325 MG  tablet Commonly known as:  TYLENOL Take 2 tablets (650 mg total) by mouth every 6 (six) hours as needed for mild pain (or Fever >/= 101).   azithromycin 250 MG tablet Commonly known as:  ZITHROMAX Z-PAK 2 tabs a day the first day, then 1 tab a day x 4 days   calcium-vitamin D 500-200 MG-UNIT tablet Commonly known as:  OSCAL WITH D Take 1 tablet by mouth daily.   denosumab 60 MG/ML Soln injection Commonly known as:  PROLIA Inject 60 mg into the skin every 6 (six) months. Administer in upper arm, thigh, or abdomen   donepezil 10 MG tablet Commonly known as:  ARICEPT Take 0.5 tablets (5 mg total) by mouth 2 (two) times daily.   hydrocortisone 2.5 % cream Apply topically 2 (two) times daily as needed.   multivitamin with minerals tablet Take 1 tablet by mouth daily.          Objective:   Physical Exam BP 122/70 (BP Location: Right Arm, Patient Position: Sitting, Cuff Size: Small)   Pulse 81   Temp 97.5 F (36.4 C) (Oral)  Resp 12   Ht 5\' 3"  (1.6 m)   Wt 134 lb 2 oz (60.8 kg)   SpO2 90%   BMI 23.76 kg/m  General:   Well developed, well nourished . NAD.  HEENT:  Normocephalic . Face symmetric, atraumatic ears . TMs obscured by wax. Nose is slightly congested. Throat symmetric, not read. Lungs:  CTA B, no crackles, no wheezing. Large airway congestion with cough. Normal respiratory effort, no intercostal retractions, no accessory muscle use. Heart: RRR,  no murmur.  no pretibial edema bilaterally  Abdomen:  Not distended, soft, non-tender. No rebound or rigidity. No CVA tenderness Skin: Not pale. Not jaundice Neurologic:  alert & pleasently demented, gait appropriate for age and  assisted I taken Psych--  No anxious or depressed appearing.    Assessment & Plan:    Assessment  DJD, used to see [facility] COPD -- on meds prn Osteoporosis - s/p forteo before, saw Dr Cruzita Lederer 2015 rec prolia   T score 2013 -3.6, forearm, ; -3.2 femur (Lunar prodigy advance) T  score 02-2016 -3.9, forearm, -3.2 femur (Lunar iDXA)  (matching is different, difficult to compare results). Urinary incontinence Dementia--- aricept started ~ 2011 Skin ca-- SCC, does not see derm regularly  Left leg edema: (-) [place] ~ 10-2014  PLAN: COPD: Has mild chronic cough, slightly worse lately. Check a CBC and BMP Low grade fever, feeling poorly--  not feeling well for the last 24 hours, simply more withdrawn than usual. Apparently had a low-grade temperature yesterday. Chronic cough is slightly worse. On exam she has some chest congestion. Flu test negative. Will get a UA-UCX but  treat as bronchitis with a zpack. See instructions Bronchitis-- see above  Next prolia by 08-2016  RTC 8-10 months

## 2016-07-18 NOTE — Patient Instructions (Addendum)
GO TO THE LAB : Get the blood work     GO TO THE FRONT DESK Schedule your next appointment for a  routine checkup in 8-10 months  Next PROLIA 08-2016  ======================   Rest, fluids , tylenol  For cough:  Take  Robitussin DM   as needed until better    Take the antibiotic as prescribed  (zithromax)  Call if not gradually better over the next  10 days  Call anytime if the symptoms are severe

## 2016-07-18 NOTE — Progress Notes (Signed)
Pre visit review using our clinic review tool, if applicable. No additional management support is needed unless otherwise documented below in the visit note. 

## 2016-07-19 LAB — BASIC METABOLIC PANEL
BUN: 19 mg/dL (ref 6–23)
CO2: 29 mEq/L (ref 19–32)
CREATININE: 1.02 mg/dL (ref 0.40–1.20)
Calcium: 9.3 mg/dL (ref 8.4–10.5)
Chloride: 107 mEq/L (ref 96–112)
GFR: 54.58 mL/min — AB (ref >60.00)
GLUCOSE: 90 mg/dL (ref 70–99)
Potassium: 4.2 mEq/L (ref 3.5–5.1)
Sodium: 143 mEq/L (ref 135–145)

## 2016-07-19 LAB — CBC WITH DIFFERENTIAL/PLATELET
BASOS PCT: 0.6 % (ref 0.0–3.0)
Basophils Absolute: 0.1 10*3/uL (ref 0.0–0.1)
EOS PCT: 0.7 % (ref 0.0–5.0)
Eosinophils Absolute: 0.1 10*3/uL (ref 0.0–0.7)
HEMATOCRIT: 40.3 % (ref 36.0–46.0)
Hemoglobin: 13.4 g/dL (ref 12.0–15.0)
Lymphocytes Relative: 9.8 % — ABNORMAL LOW (ref 12.0–46.0)
Lymphs Abs: 1.1 10*3/uL (ref 0.7–4.0)
MCHC: 33.2 g/dL (ref 30.0–36.0)
MCV: 87 fl (ref 78.0–100.0)
MONOS PCT: 9.7 % (ref 3.0–12.0)
Monocytes Absolute: 1.1 10*3/uL — ABNORMAL HIGH (ref 0.1–1.0)
NEUTROS ABS: 8.6 10*3/uL — AB (ref 1.4–7.7)
Neutrophils Relative %: 79.2 % — ABNORMAL HIGH (ref 43.0–77.0)
PLATELETS: 188 10*3/uL (ref 150.0–400.0)
RBC: 4.63 Mil/uL (ref 3.87–5.11)
RDW: 15.3 % (ref 11.5–15.5)
WBC: 10.9 10*3/uL — ABNORMAL HIGH (ref 4.0–10.5)

## 2016-07-19 NOTE — Assessment & Plan Note (Signed)
COPD: Has mild chronic cough, slightly worse lately. Check a CBC and BMP Low grade fever, feeling poorly--  not feeling well for the last 24 hours, simply more withdrawn than usual. Apparently had a low-grade temperature yesterday. Chronic cough is slightly worse. On exam she has some chest congestion. Flu test negative. Will get a UA-UCX but  treat as bronchitis with a zpack. See instructions Bronchitis-- see above  Next prolia by 08-2016  RTC 8-10 months

## 2016-07-20 NOTE — Addendum Note (Signed)
Addended by: Caffie Pinto on: 07/20/2016 09:45 AM   Modules accepted: Orders

## 2016-08-15 ENCOUNTER — Ambulatory Visit: Payer: PRIVATE HEALTH INSURANCE

## 2016-08-27 ENCOUNTER — Encounter: Payer: Self-pay | Admitting: Family Medicine

## 2016-08-27 ENCOUNTER — Ambulatory Visit (HOSPITAL_BASED_OUTPATIENT_CLINIC_OR_DEPARTMENT_OTHER)
Admission: RE | Admit: 2016-08-27 | Discharge: 2016-08-27 | Disposition: A | Discharge status: Home / Self Care | Payer: Medicare Other | Source: Ambulatory Visit | Attending: Family Medicine | Admitting: Family Medicine

## 2016-08-27 ENCOUNTER — Telehealth: Payer: Self-pay | Admitting: Internal Medicine

## 2016-08-27 ENCOUNTER — Ambulatory Visit (INDEPENDENT_AMBULATORY_CARE_PROVIDER_SITE_OTHER): Payer: Medicare Other | Admitting: Family Medicine

## 2016-08-27 VITALS — BP 120/70 | HR 91 | Temp 98.2°F | Ht 63.0 in | Wt 136.4 lb

## 2016-08-27 DIAGNOSIS — I517 Cardiomegaly: Secondary | ICD-10-CM | POA: Diagnosis not present

## 2016-08-27 DIAGNOSIS — R05 Cough: Secondary | ICD-10-CM | POA: Diagnosis not present

## 2016-08-27 DIAGNOSIS — R4182 Altered mental status, unspecified: Secondary | ICD-10-CM

## 2016-08-27 DIAGNOSIS — J90 Pleural effusion, not elsewhere classified: Secondary | ICD-10-CM | POA: Diagnosis not present

## 2016-08-27 DIAGNOSIS — R059 Cough, unspecified: Secondary | ICD-10-CM

## 2016-08-27 DIAGNOSIS — R0602 Shortness of breath: Secondary | ICD-10-CM | POA: Diagnosis not present

## 2016-08-27 DIAGNOSIS — R079 Chest pain, unspecified: Secondary | ICD-10-CM | POA: Diagnosis not present

## 2016-08-27 NOTE — Patient Instructions (Signed)
Answer all calls for tonight only. If the chest X-ray is normal, you will not receive a call.   Seek immediate care if shortness of breath/increased work of breathing, fevers, or worsening change in mentation occur.

## 2016-08-27 NOTE — Progress Notes (Addendum)
Chief Complaint  Patient presents with  . Altered Mental Status    more the last 10 days, more quiet,pt's son feels like alot of changes has taken place in the last 2 weeks  . Cough    dry-x 1 week    Subjective: Patient is a 83 y.o. female here for change in mentation.. Son provides the history  Pt has pmhx of dementia, COPD and osteoporosis, but over the past 2 weeks, he has been noticing her being more quiet and fatiguing easier than usual. She has been shaking more to the point where she is unable to drink without a straw because the liquid pours out. She is having a cough. Does not seem to have SOB or increased work of breathing. Also having some urinary incontinence. No reports of fevers, medication change, decreased PO intake, or other urinary complaints.   ROS: (according to son) Heart: +chest pain  Lungs: Denies SOB  Family History  Problem Relation Age of Onset  . Brain cancer Father   . Colon cancer Neg Hx   . Breast cancer Neg Hx    Past Medical History:  Diagnosis Date  . Complication of anesthesia    lot of confusion  . COPD (chronic obstructive pulmonary disease) ([place])   . Dementia   . Osteoporosis    No Known Allergies  Current Outpatient Prescriptions:  .  acetaminophen (TYLENOL) 325 MG tablet, Take 2 tablets (650 mg total) by mouth every 6 (six) hours as needed for mild pain (or Fever >/= 101)., Disp: , Rfl:  .  calcium-vitamin D (OSCAL WITH D) 500-200 MG-UNIT per tablet, Take 1 tablet by mouth daily., Disp: , Rfl:  .  denosumab (PROLIA) 60 MG/ML SOLN injection, Inject 60 mg into the skin every 6 (six) months. Administer in upper arm, thigh, or abdomen, Disp: , Rfl:  .  donepezil (ARICEPT) 10 MG tablet, Take 0.5 tablets (5 mg total) by mouth 2 (two) times daily., Disp: 90 tablet, Rfl: 2 .  hydrocortisone 2.5 % cream, Apply topically 2 (two) times daily as needed., Disp: 30 g, Rfl: 2 .  Multiple Vitamins-Minerals (MULTIVITAMIN WITH MINERALS) tablet, Take 1  tablet by mouth daily., Disp: , Rfl:   Objective: BP 120/70 (BP Location: Left Arm, Patient Position: Sitting, Cuff Size: Normal)   Pulse 91   Temp 98.2 F (36.8 C) (Oral)   Ht 5\' 3"  (1.6 m)   Wt 136 lb 6.4 oz (61.9 kg)   SpO2 94%   BMI 24.16 kg/m  General: Awake, appears stated age HEENT: MMM, EOMi, ears neg b/l Heart: RRR, no murmurs, no LE edema, no bruits Lungs: CTAB, no rales, wheezes or rhonchi. No accessory muscle use, poor effort. MSK: +L sided chest wall pain, gait is slow and unsteady Abd: BS+, soft, NT, ND, no masses or organomegaly Psych: Limited judgment and insight, pleasantly demented  Assessment and Plan: Altered mental status, unspecified altered mental status type - Plan: Comprehensive metabolic panel, TSH, CBC, DG Chest 2 View  Cough - Plan: DG Chest 2 View  Orders as above. Will r/o metabolic abnormalities, UTI and PNA. She does have osteoporosis and will r/o fracture from coughing. F/u pending above. Told son to answer blocked calls for tonight only. If CXR is neg, will not receive a call.  The patient's son voiced understanding and agreement to the plan.  [place], DO 08/27/16  2:51 PM

## 2016-08-27 NOTE — Telephone Encounter (Signed)
[employer] benefits verified No PA required Patient will owe $0 out of pocket  Due after 08/27/16

## 2016-08-27 NOTE — Telephone Encounter (Signed)
Letter printed and mailed to Pt, instructed to call and schedule appt.

## 2016-08-27 NOTE — Progress Notes (Signed)
Pre visit review using our clinic review tool, if applicable. No additional management support is needed unless otherwise documented below in the visit note. 

## 2016-08-28 ENCOUNTER — Telehealth: Payer: Self-pay | Admitting: *Deleted

## 2016-08-28 DIAGNOSIS — J90 Pleural effusion, not elsewhere classified: Secondary | ICD-10-CM

## 2016-08-28 DIAGNOSIS — R748 Abnormal levels of other serum enzymes: Secondary | ICD-10-CM

## 2016-08-28 LAB — CBC
HCT: 42.7 % (ref 36.0–46.0)
Hemoglobin: 13.6 g/dL (ref 12.0–15.0)
MCHC: 31.9 g/dL (ref 30.0–36.0)
MCV: 84.4 fl (ref 78.0–100.0)
Platelets: 396 10*3/uL (ref 150.0–400.0)
RBC: 5.06 Mil/uL (ref 3.87–5.11)
RDW: 15 % (ref 11.5–15.5)
WBC: 12.3 10*3/uL — ABNORMAL HIGH (ref 4.0–10.5)

## 2016-08-28 LAB — COMPREHENSIVE METABOLIC PANEL
ALBUMIN: 3.1 g/dL — AB (ref 3.5–5.2)
ALK PHOS: 120 U/L — AB (ref 39–117)
ALT: 6 U/L (ref 0–35)
AST: 28 U/L (ref 0–37)
BILIRUBIN TOTAL: 0.4 mg/dL (ref 0.2–1.2)
BUN: 8 mg/dL (ref 6–23)
CO2: 26 mEq/L (ref 19–32)
Calcium: 9 mg/dL (ref 8.4–10.5)
Chloride: 101 mEq/L (ref 96–112)
Creatinine, Ser: 0.76 mg/dL (ref 0.40–1.20)
GFR: 76.63 mL/min (ref >60.00)
GLUCOSE: 94 mg/dL (ref 70–99)
Potassium: 4.2 mEq/L (ref 3.5–5.1)
SODIUM: 137 meq/L (ref 135–145)
TOTAL PROTEIN: 6.6 g/dL (ref 6.0–8.3)

## 2016-08-28 LAB — TSH: TSH: 1.05 u[IU]/mL (ref 0.35–4.50)

## 2016-08-28 NOTE — Telephone Encounter (Signed)
-----   Message from Shelda Pal, DO sent at 08/27/2016  9:28 PM EDT ----- Called patient's son to inform him of the above result. This could certainly be the cause of the L sided chest pain.  AB- please place a referral to interventional radiology for L sided pleural effusion. TY.

## 2016-08-28 NOTE — Telephone Encounter (Signed)
Called and spoke with the pt's son and informed him of the pt's recent lab results and note.  He verbalized understanding and had no question or concerns.  Pt was scheduled a lab appt for (Tues-May 1,18 @ 4:00pm).  Future lab ordered and sent.//AB/CMA

## 2016-08-28 NOTE — Telephone Encounter (Signed)
-----  Message from Shelda Pal, DO sent at 08/28/2016  1:54 PM EDT ----- Mild elevation of alk phos, leukocytosis. Will follow up on alk phos in future. WBC could be due to large pleural effusion.  AB- let pt's son know that labs are largely unremarkable, she does have an elevated white count which is likely due to the fluid around her lung. She also has an elevated metabolic marker that we will follow up on in 6 weeks. Please place order for hepatic panel, ggt and alkaline phosphatase isoenzyme with dx of elevated alk phos and schedule lab appointment. TY.

## 2016-08-28 NOTE — Telephone Encounter (Signed)
Pt's CXR showed L sided pleural effusion. Order placed to IR to drain and run cytology. Discussed with patient's son the plan and that we will be contacting him. Given her O2 sat and that she is not having increased work of breathing, I did not think it was necessary to send her to ED.   [place] on order placed only, TY.

## 2016-08-30 ENCOUNTER — Other Ambulatory Visit (INDEPENDENT_AMBULATORY_CARE_PROVIDER_SITE_OTHER): Payer: Medicare Other

## 2016-08-30 ENCOUNTER — Other Ambulatory Visit: Payer: Self-pay | Admitting: Family Medicine

## 2016-08-30 DIAGNOSIS — R509 Fever, unspecified: Secondary | ICD-10-CM | POA: Diagnosis not present

## 2016-08-30 DIAGNOSIS — R5383 Other fatigue: Secondary | ICD-10-CM | POA: Diagnosis not present

## 2016-08-30 LAB — URINALYSIS, ROUTINE W REFLEX MICROSCOPIC
Bilirubin Urine: NEGATIVE
HGB URINE DIPSTICK: NEGATIVE
Leukocytes, UA: NEGATIVE
Nitrite: NEGATIVE
RBC / HPF: NONE SEEN (ref 0–?)
SPECIFIC GRAVITY, URINE: 1.015 (ref 1.000–1.030)
Total Protein, Urine: NEGATIVE
URINE GLUCOSE: NEGATIVE
UROBILINOGEN UA: 0.2 (ref 0.0–1.0)
pH: 6 (ref 5.0–8.0)

## 2016-08-31 NOTE — Addendum Note (Signed)
Addended by: Harl Bowie on: 08/31/2016 03:57 PM   Modules accepted: Orders

## 2016-09-01 LAB — URINE CULTURE

## 2016-09-02 ENCOUNTER — Other Ambulatory Visit: Payer: Self-pay | Admitting: Internal Medicine

## 2016-09-02 MED ORDER — CEFUROXIME AXETIL 500 MG PO TABS: 500.0000 mg | ORAL_TABLET | Freq: Two times a day (BID) | ORAL | 0 refills | Status: DC

## 2016-09-05 ENCOUNTER — Encounter (HOSPITAL_COMMUNITY): Payer: Self-pay | Admitting: Student

## 2016-09-05 ENCOUNTER — Ambulatory Visit (HOSPITAL_COMMUNITY)
Admission: RE | Admit: 2016-09-05 | Discharge: 2016-09-05 | Disposition: A | Discharge status: Home / Self Care | Payer: Medicare Other | Source: Ambulatory Visit | Attending: Student | Admitting: Student

## 2016-09-05 ENCOUNTER — Ambulatory Visit (HOSPITAL_COMMUNITY)
Admission: RE | Admit: 2016-09-05 | Discharge: 2016-09-05 | Disposition: A | Discharge status: Home / Self Care | Payer: Medicare Other | Source: Ambulatory Visit | Attending: Family Medicine | Admitting: Family Medicine

## 2016-09-05 DIAGNOSIS — Z9889 Other specified postprocedural states: Secondary | ICD-10-CM

## 2016-09-05 DIAGNOSIS — R846 Abnormal cytological findings in specimens from respiratory organs and thorax: Secondary | ICD-10-CM | POA: Diagnosis not present

## 2016-09-05 DIAGNOSIS — J90 Pleural effusion, not elsewhere classified: Secondary | ICD-10-CM | POA: Diagnosis present

## 2016-09-05 DIAGNOSIS — J449 Chronic obstructive pulmonary disease, unspecified: Secondary | ICD-10-CM | POA: Insufficient documentation

## 2016-09-05 HISTORY — PX: IR GENERIC HISTORICAL: IMG1180011

## 2016-09-05 LAB — PROTEIN, PLEURAL OR PERITONEAL FLUID: Total protein, fluid: 4 g/dL

## 2016-09-05 LAB — BODY FLUID CELL COUNT WITH DIFFERENTIAL
EOS FL: 25 %
Lymphs, Fluid: 57 %
Monocyte-Macrophage-Serous Fluid: 18 % — ABNORMAL LOW (ref 50–90)
Neutrophil Count, Fluid: 0 % (ref 0–25)
Total Nucleated Cell Count, Fluid: 940 cu mm (ref 0–1000)

## 2016-09-05 LAB — GLUCOSE, PLEURAL OR PERITONEAL FLUID: GLUCOSE FL: 97 mg/dL

## 2016-09-05 LAB — GRAM STAIN

## 2016-09-05 LAB — LACTATE DEHYDROGENASE, PLEURAL OR PERITONEAL FLUID: LD, Fluid: 123 U/L — ABNORMAL HIGH (ref 3–23)

## 2016-09-05 MED ORDER — LIDOCAINE HCL (PF) 1 % IJ SOLN
INTRAMUSCULAR | Status: AC
  Filled 2016-09-05: qty 30

## 2016-09-05 NOTE — Procedures (Signed)
PROCEDURE SUMMARY:  Successful US guided diagnostic and therapeutic left thoracentesis. Yielded 1.0L of hazy, yellow fluid. Pt tolerated procedure well. No immediate complications.  Procedure was stopped prior to removal of all fluid due to patient discomfort.   Specimen was sent for labs. CXR ordered.  Docia Barrier PA-C 09/05/2016 10:20 AM

## 2016-09-10 LAB — CULTURE, BODY FLUID W GRAM STAIN -BOTTLE: Culture: NO GROWTH

## 2016-09-11 DIAGNOSIS — M79642 Pain in left hand: Secondary | ICD-10-CM | POA: Diagnosis not present

## 2016-09-11 DIAGNOSIS — R2232 Localized swelling, mass and lump, left upper limb: Secondary | ICD-10-CM | POA: Diagnosis not present

## 2016-09-12 ENCOUNTER — Telehealth: Payer: Self-pay | Admitting: Family Medicine

## 2016-09-12 ENCOUNTER — Other Ambulatory Visit: Payer: Self-pay | Admitting: Family Medicine

## 2016-09-12 DIAGNOSIS — J90 Pleural effusion, not elsewhere classified: Secondary | ICD-10-CM

## 2016-09-12 NOTE — Telephone Encounter (Signed)
I would also like her to see pulmonology regarding her pleural effusion (fluid in lung space around the lung). TY.

## 2016-09-13 ENCOUNTER — Telehealth: Payer: Self-pay | Admitting: Internal Medicine

## 2016-09-13 MED ORDER — BUDESONIDE-FORMOTEROL FUMARATE 80-4.5 MCG/ACT IN AERO: 2.0000 | INHALATION_SPRAY | Freq: Two times a day (BID) | RESPIRATORY_TRACT | 3 refills | Status: DC

## 2016-09-13 MED ORDER — ALBUTEROL SULFATE HFA 108 (90 BASE) MCG/ACT IN AERS
2.0000 | INHALATION_SPRAY | Freq: Four times a day (QID) | RESPIRATORY_TRACT | 2 refills | Status: DC | PRN
Start: 2016-09-13 — End: 2017-01-10

## 2016-09-13 NOTE — Telephone Encounter (Signed)
Please advise 

## 2016-09-13 NOTE — Telephone Encounter (Signed)
Please talk with the patient's son, if she has COPD type of symptoms and they are not severe, then restart Symbicort twice a day and albuterol as needed. If severe symptoms let me know. Prescriptions ready to be send if needed.

## 2016-09-13 NOTE — Telephone Encounter (Signed)
Spoke w/ patient's son, current symptoms are described as 'very little to none,' and a mild cough that son feels are r/t seasonal allergies. Medications below filled to pharmacy as requested. Pt's son notified of dosing instructions and verbalized understanding.

## 2016-09-13 NOTE — Telephone Encounter (Signed)
Caller name:Utter,John Relation to pt:son Call back number:[phone] Pharmacy: [id number] - [place], [place] [phone] (Phone) [phone] (Fax)    Reason for call:  Son requesting 'inhaler" due to season changing and patient allergies, please advise

## 2016-09-13 NOTE — Telephone Encounter (Signed)
Called and [facility] @ 10:47am @ [phone]) pt's son [name]). Informing the son of the message below.  Asking the son to RTC regarding the referral.//AB/CMA

## 2016-09-13 NOTE — Telephone Encounter (Signed)
Spoke with the pt's son [name]) and informed him that the referral has been placed and he should receive a call regarding an appt.  He verbalized understanding and agreed.  Informed him if he does not hear something in a few days to please give [place] a call back.  He agreed.//AB/CMA

## 2016-09-13 NOTE — Telephone Encounter (Signed)
Son [name]) called back to inform provider that he is in agreement with patient being seen by pulmonary.

## 2016-09-13 NOTE — Telephone Encounter (Signed)
thx

## 2016-10-09 ENCOUNTER — Other Ambulatory Visit (INDEPENDENT_AMBULATORY_CARE_PROVIDER_SITE_OTHER): Payer: Medicare Other

## 2016-10-09 ENCOUNTER — Telehealth: Payer: Self-pay | Admitting: Family Medicine

## 2016-10-09 DIAGNOSIS — R748 Abnormal levels of other serum enzymes: Secondary | ICD-10-CM | POA: Diagnosis not present

## 2016-10-10 LAB — HEPATIC FUNCTION PANEL
ALT: 9 U/L (ref 0–35)
AST: 16 U/L (ref 0–37)
Albumin: 3.4 g/dL — ABNORMAL LOW (ref 3.5–5.2)
Alkaline Phosphatase: 80 U/L (ref 39–117)
BILIRUBIN DIRECT: 0.1 mg/dL (ref 0.0–0.3)
BILIRUBIN TOTAL: 0.4 mg/dL (ref 0.2–1.2)
Total Protein: 6.8 g/dL (ref 6.0–8.3)

## 2016-10-10 LAB — GAMMA GT: GGT: 20 U/L (ref 7–51)

## 2016-10-12 LAB — ALKALINE PHOSPHATASE, ISOENZYMES
Alkaline Phosphatase: 92 IU/L (ref 39–117)
BONE FRACTION: 22 % (ref 14–68)
INTESTINAL FRAC.: 5 % (ref 0–18)
LIVER FRACTION: 73 % (ref 18–85)

## 2016-11-08 ENCOUNTER — Encounter: Payer: Self-pay | Admitting: Pulmonary Disease

## 2016-11-08 ENCOUNTER — Ambulatory Visit (INDEPENDENT_AMBULATORY_CARE_PROVIDER_SITE_OTHER): Payer: Medicare Other | Admitting: Pulmonary Disease

## 2016-11-08 ENCOUNTER — Other Ambulatory Visit (INDEPENDENT_AMBULATORY_CARE_PROVIDER_SITE_OTHER): Payer: Medicare Other

## 2016-11-08 ENCOUNTER — Ambulatory Visit (INDEPENDENT_AMBULATORY_CARE_PROVIDER_SITE_OTHER)
Admission: RE | Admit: 2016-11-08 | Discharge: 2016-11-08 | Disposition: A | Discharge status: Home / Self Care | Payer: Medicare Other | Source: Ambulatory Visit | Attending: Pulmonary Disease | Admitting: Pulmonary Disease

## 2016-11-08 VITALS — BP 124/72 | HR 72 | Ht 64.0 in | Wt 122.0 lb

## 2016-11-08 DIAGNOSIS — R0602 Shortness of breath: Secondary | ICD-10-CM

## 2016-11-08 DIAGNOSIS — J90 Pleural effusion, not elsewhere classified: Secondary | ICD-10-CM

## 2016-11-08 LAB — CBC WITH DIFFERENTIAL/PLATELET
Basophils Absolute: 0 10*3/uL (ref 0.0–0.1)
Basophils Relative: 0.5 % (ref 0.0–3.0)
Eosinophils Absolute: 0.2 10*3/uL (ref 0.0–0.7)
Eosinophils Relative: 2.6 % (ref 0.0–5.0)
HCT: 44.9 % (ref 36.0–46.0)
HEMOGLOBIN: 14.7 g/dL (ref 12.0–15.0)
LYMPHS PCT: 25 % (ref 12.0–46.0)
Lymphs Abs: 2.1 10*3/uL (ref 0.7–4.0)
MCHC: 32.8 g/dL (ref 30.0–36.0)
MCV: 81.1 fl (ref 78.0–100.0)
MONO ABS: 0.8 10*3/uL (ref 0.1–1.0)
Monocytes Relative: 9.3 % (ref 3.0–12.0)
Neutro Abs: 5.3 10*3/uL (ref 1.4–7.7)
Neutrophils Relative %: 62.6 % (ref 43.0–77.0)
Platelets: 204 10*3/uL (ref 150.0–400.0)
RBC: 5.54 Mil/uL — AB (ref 3.87–5.11)
RDW: 19.7 % — ABNORMAL HIGH (ref 11.5–15.5)
WBC: 8.5 10*3/uL (ref 4.0–10.5)

## 2016-11-08 LAB — BASIC METABOLIC PANEL
BUN: 11 mg/dL (ref 6–23)
CALCIUM: 9.6 mg/dL (ref 8.4–10.5)
CO2: 29 meq/L (ref 19–32)
Chloride: 104 mEq/L (ref 96–112)
Creatinine, Ser: 0.87 mg/dL (ref 0.40–1.20)
GFR: 65.53 mL/min (ref >60.00)
GLUCOSE: 82 mg/dL (ref 70–99)
POTASSIUM: 4.5 meq/L (ref 3.5–5.1)
SODIUM: 138 meq/L (ref 135–145)

## 2016-11-08 LAB — C-REACTIVE PROTEIN: CRP: 1 mg/dL (ref 0.5–20.0)

## 2016-11-08 LAB — SEDIMENTATION RATE: SED RATE: 23 mm/h (ref 0–30)

## 2016-11-08 LAB — PROTIME-INR
INR: 1 ratio (ref 0.8–1.0)
Prothrombin Time: 11.1 s (ref 9.6–13.1)

## 2016-11-08 NOTE — Progress Notes (Signed)
Donna Flowers    [id number]    1928/05/26  Primary Care Physician:Paz, Alda Berthold, MD  Referring Physician: Shelda Pal, [address] [place], [place] [address]  Chief complaint:  Consult for evaluation of dyspnea, COPD, pleural effusion  HPI: 83 year old with history of rheumatoid arthritis, dementia, COPD, osteoarthritis. She had several episodes of bronchitis over the past winter which were treated with antibiotics. Her last dose of antibiotic was and January. She was found to have a large left pleural effusion upon evaluation for dyspnea and chest pain. She underwent a thoracentesis by IR on March 2018 with removal of 1 L of exudative fluid. Cell count was significant for elevated lymphocytes and eosinophils. There is no growth on cultures or malignancy on cytology. She did not receive any antibiotics around this time.  She is a lifelong nonsmoker but had significant exposure to secondhand smoke. She had PFTs in 2013 that showed obstruction. She has been prescribed Symbicort but is not taking it on a regular basis. Chief complaint is dyspnea with activity and rest, nonproductive cough with occasional wheezing. She has significant history of rheumatoid arthritis and was on treatment several years ago. His son is unable to recall what it was. She is currently not on treatment for rheumatoid arthritis.  Pets: None Occupation: Retired Freight forwarder for a [employer] Exposures: No known exposures. No mold issues at home Smoking history: Nonsmoker but has exposure to secondhand smoke  Outpatient Encounter Prescriptions as of 11/08/2016  Medication Sig  . acetaminophen (TYLENOL) 325 MG tablet Take 2 tablets (650 mg total) by mouth every 6 (six) hours as needed for mild pain (or Fever >/= 101).  Marland Kitchen albuterol (PROVENTIL HFA;VENTOLIN HFA) 108 (90 Base) MCG/ACT inhaler Inhale 2 puffs into the lungs every 6 (six) hours as needed for wheezing or shortness of  breath.  . budesonide-formoterol (SYMBICORT) 80-4.5 MCG/ACT inhaler Inhale 2 puffs into the lungs 2 (two) times daily.  . calcium-vitamin D (OSCAL WITH D) 500-200 MG-UNIT per tablet Take 1 tablet by mouth daily.  Marland Kitchen denosumab (PROLIA) 60 MG/ML SOLN injection Inject 60 mg into the skin every 6 (six) months. Administer in upper arm, thigh, or abdomen  . donepezil (ARICEPT) 10 MG tablet Take 0.5 tablets (5 mg total) by mouth 2 (two) times daily.  . hydrocortisone 2.5 % cream Apply topically 2 (two) times daily as needed.  . Multiple Vitamins-Minerals (MULTIVITAMIN WITH MINERALS) tablet Take 1 tablet by mouth daily.  . [DISCONTINUED] cefUROXime (CEFTIN) 500 MG tablet Take 1 tablet (500 mg total) by mouth 2 (two) times daily with a meal.   No facility-administered encounter medications on file as of 11/08/2016.     Allergies as of 11/08/2016  . (No Known Allergies)    Past Medical History:  Diagnosis Date  . Complication of anesthesia    lot of confusion  . COPD (chronic obstructive pulmonary disease) ([place])   . Dementia   . Osteoporosis     Past Surgical History:  Procedure Laterality Date  . ANKLE SURGERY     right  . IR GENERIC HISTORICAL  09/05/2016   IR THORACENTESIS ASP PLEURAL SPACE W/IMG GUIDE 09/05/2016 Docia Barrier, PA [facility]-INTERV [facility]  . JOINT REPLACEMENT     right hip, left knee    Family History  Problem Relation Age of Onset  . Brain cancer Father   . Colon cancer Neg Hx   . Breast cancer Neg Hx  Social History   Social History  . Marital status: Widowed    Spouse name: N/A  . Number of children: 2  . Years of education: N/A   Occupational History  . elderly    Social History Main Topics  . Smoking status: Never Smoker  . Smokeless tobacco: Never Used  . Alcohol use No  . Drug use: No  . Sexual activity: Not on file   Other Topics Concern  . Not on file   Social History Narrative   Lives by herself.   Since admission 09-2013, has a  sitter M-F  8-5 pm. Son visit after 5 pm till she goes to sleep   [name] (son) [phone]   [name] (lives in [place])             Review of systems: Review of Systems  Constitutional: Negative for fever and chills.  HENT: Negative.   Eyes: Negative for blurred vision.  Respiratory: as per HPI  Cardiovascular: Negative for chest pain and palpitations.  Gastrointestinal: Negative for vomiting, diarrhea, blood per rectum. Genitourinary: Negative for dysuria, urgency, frequency and hematuria.  Musculoskeletal: Negative for myalgias, back pain and joint pain.  Skin: Negative for itching and rash.  Neurological: Negative for dizziness, tremors, focal weakness, seizures and loss of consciousness.  Endo/Heme/Allergies: Negative for environmental allergies.  Psychiatric/Behavioral: Negative for depression, suicidal ideas and hallucinations.  All other systems reviewed and are negative.  Physical Exam: Blood pressure 124/72, pulse 72, height '5\' 4"'  (1.626 m), weight 122 lb (55.3 kg), SpO2 92 %. Gen:      No acute distress HEENT:  EOMI, sclera anicteric Neck:     No masses; no thyromegaly Lungs:    Reduced breath sounds on the left with dullness to percussion; normal respiratory effort CV:         Regular rate and rhythm; no murmurs Abd:      + bowel sounds; soft, non-tender; no palpable masses, no distension Ext:    No edema; adequate peripheral perfusion, Skin:      Warm and dry; no rash Neuro: alert and oriented x 3 Psych: normal mood and affect  Data Reviewed: Pleural studies 09/05/16 LDH 123 , protein 4 WBC 940, 57% lymphs, 25% eos, 18% monocyte macrophage Culture- negative Cytology-lymphocytes and eosinophils, reactive mesothelial cells. No malignant cells identified.  CT abdomen pelvis 05/01/15-bibasilar atelectasis on lung images Chest x-ray 08/27/16-large left pleural effusion Chest x-ray 09/05/16-slight decrease in left effusion after thoracentesis I had reviewed all images  personally.  PFTs 10/30/11 FVC 2.83 [106%] FEV1 1.72 (94%) F/F 61 TLC 105% DLCO 101% Moderate obstruction with no bronchodilator response.  Assessment:  Large left pleural effusion Appears to be exudative with lymphocytes and eosinophil predominance. This could be had parapneumonic but she does not have any evidence of active infection. We need to consider malignancy, rheumatoid effusion. We will get a repeat chest x-ray and plan for follow-up thoracentesis if there is persistence of effusion. Check labs including CBC with differential to evaluate peripheral eosinophilia, autoimmune panel with rheumatoid factor, CCP, sedimentation rate, ANA, ANCA.   COPD Has COPD even though she is a nonsmoker. She does not appear to be using the Symbicort due to issues with dexterity. I'll give her a nebulizer with DuoNebs. She will require repeat PFTs but we will wait until we have dealt with a pleural effusion.  Plan/Recommendations: - CXR. Consider repeat thoracentesis - CBC, met panel, ANA, CCP, RF, CRP. - Nebulizer with duonebs.   Tamberlyn Midgley  MD [facility] Pager [phone] 11/08/2016, 11:04 AM  CC: Shelda Pal*

## 2016-11-08 NOTE — Patient Instructions (Addendum)
We will check blood work including CBC with differential, basic metabolic panel, PT/INR ANA with reflex, CRP, sed rate, CCP, RF and ANCA  We will get a CXR. Based on the result we may need to do a repeat thoracentesis  For COPD its ok to stop the symbicort. We will give nebulizer with prescription for duonebs tid.  Return in 1-2 months

## 2016-11-09 ENCOUNTER — Other Ambulatory Visit: Payer: Self-pay

## 2016-11-09 DIAGNOSIS — J90 Pleural effusion, not elsewhere classified: Secondary | ICD-10-CM

## 2016-11-09 LAB — ANCA SCREEN W REFLEX TITER: ANCA SCREEN: NEGATIVE

## 2016-11-09 LAB — CYCLIC CITRUL PEPTIDE ANTIBODY, IGG: Cyclic Citrullin Peptide Ab: <16 Units

## 2016-11-09 LAB — RHEUMATOID FACTOR: Rhuematoid fact SerPl-aCnc: 18 IU/mL — ABNORMAL HIGH (ref <14)

## 2016-11-09 LAB — ANA W/REFLEX: ANA: NEGATIVE

## 2016-11-16 ENCOUNTER — Ambulatory Visit (INDEPENDENT_AMBULATORY_CARE_PROVIDER_SITE_OTHER)
Admission: RE | Admit: 2016-11-16 | Discharge: 2016-11-16 | Disposition: A | Discharge status: Home / Self Care | Payer: Medicare Other | Source: Ambulatory Visit | Attending: Pulmonary Disease | Admitting: Pulmonary Disease

## 2016-11-16 DIAGNOSIS — J90 Pleural effusion, not elsewhere classified: Secondary | ICD-10-CM | POA: Diagnosis not present

## 2016-11-20 ENCOUNTER — Other Ambulatory Visit: Payer: Self-pay | Admitting: Pulmonary Disease

## 2016-11-20 DIAGNOSIS — J9819 Other pulmonary collapse: Secondary | ICD-10-CM

## 2016-11-20 MED ORDER — LEVOFLOXACIN 500 MG PO TABS: 500.0000 mg | ORAL_TABLET | Freq: Every day | ORAL | 0 refills | Status: DC

## 2016-11-20 NOTE — Progress Notes (Signed)
Contacted patient home number, but caregiver stated she is unavailable. Pt mobile number was contacted and pt son, Donna Flowers answered. Being he was on her DPR, he was made aware of recommendations. He verbalized understanding and did not have any questions. Orders placed for f/u xray and levaquin. Nothing further is needed.

## 2017-01-08 ENCOUNTER — Ambulatory Visit (INDEPENDENT_AMBULATORY_CARE_PROVIDER_SITE_OTHER)
Admission: RE | Admit: 2017-01-08 | Discharge: 2017-01-08 | Disposition: A | Discharge status: Home / Self Care | Payer: Medicare Other | Source: Ambulatory Visit | Attending: Pulmonary Disease | Admitting: Pulmonary Disease

## 2017-01-08 DIAGNOSIS — J9819 Other pulmonary collapse: Secondary | ICD-10-CM

## 2017-01-08 DIAGNOSIS — J90 Pleural effusion, not elsewhere classified: Secondary | ICD-10-CM | POA: Diagnosis not present

## 2017-01-10 ENCOUNTER — Ambulatory Visit (INDEPENDENT_AMBULATORY_CARE_PROVIDER_SITE_OTHER): Payer: Medicare Other | Admitting: Pulmonary Disease

## 2017-01-10 ENCOUNTER — Encounter: Payer: Self-pay | Admitting: Pulmonary Disease

## 2017-01-10 VITALS — BP 122/64 | HR 83 | Ht 64.0 in | Wt 122.4 lb

## 2017-01-10 DIAGNOSIS — R0602 Shortness of breath: Secondary | ICD-10-CM | POA: Diagnosis not present

## 2017-01-10 DIAGNOSIS — J9811 Atelectasis: Secondary | ICD-10-CM

## 2017-01-10 DIAGNOSIS — J90 Pleural effusion, not elsewhere classified: Secondary | ICD-10-CM

## 2017-01-10 DIAGNOSIS — J449 Chronic obstructive pulmonary disease, unspecified: Secondary | ICD-10-CM

## 2017-01-10 MED ORDER — BUDESONIDE 0.25 MG/2ML IN SUSP: 0.2500 mg | Freq: Two times a day (BID) | RESPIRATORY_TRACT | 2 refills | Status: DC

## 2017-01-10 MED ORDER — ALBUTEROL SULFATE (2.5 MG/3ML) 0.083% IN NEBU: 2.5000 mg | INHALATION_SOLUTION | Freq: Four times a day (QID) | RESPIRATORY_TRACT | 6 refills | Status: AC | PRN

## 2017-01-10 MED ORDER — ARFORMOTEROL TARTRATE 15 MCG/2ML IN NEBU: 15.0000 ug | INHALATION_SOLUTION | Freq: Two times a day (BID) | RESPIRATORY_TRACT | 2 refills | Status: DC

## 2017-01-10 MED ORDER — FLUTTER DEVI: 0 refills | Status: AC

## 2017-01-10 MED ORDER — AMBULATORY NON FORMULARY MEDICATION: Status: AC

## 2017-01-10 NOTE — Patient Instructions (Signed)
We will stop the Symbicort and switch to Brovana and Pulmicort nebs We'll stop the Proventil and switch to albuterol nebs as needed for times a day Continue using the Mucinex We will give a flutter valve and incentive spirometer  Return to clinic in 6 months. We'll schedule a chest x-ray on the day of the return visit.

## 2017-01-10 NOTE — Progress Notes (Signed)
Donna Flowers    [id number]    [birth year]  Primary Care Physician:Paz, Alda Berthold, MD  Referring Physician: Colon Branch, MD [address] [place], [place] [address]  Chief complaint:   Follow up for COPD,  Pleural effusion Persistent LLL collapse  HPI: 84 year old with history of rheumatoid arthritis, dementia, COPD, osteoarthritis. She had several episodes of bronchitis over the winter of 2017 which were treated with antibiotics. She was found to have a large left pleural effusion upon evaluation for dyspnea and chest pain. She underwent a thoracentesis by IR on March 2018 with removal of 1 L of exudative fluid. Cell count was significant for elevated lymphocytes and eosinophils. There is no growth on cultures or malignancy on cytology.   She is a lifelong nonsmoker but had significant exposure to secondhand smoke. She had PFTs in 2013 that showed obstruction. She has been prescribed Symbicort but is not taking it on a regular basis. Chief complaint is dyspnea with activity and rest, nonproductive cough with occasional wheezing. She has significant history of rheumatoid arthritis and was on treatment several years ago. His son is unable to recall what it was. She is currently not on treatment for rheumatoid arthritis.  Pets: None Occupation: Retired Freight forwarder for a [employer] Exposures: No known exposures. No mold issues at home Smoking history: Nonsmoker but has exposure to secondhand smoke  Interim History: Since her last visit she has had CT in July which shows improvement in the pleural effusion. However she has persistent left lower lobe collapse. We treated her with another round of antibiotic (levaquin) due to the persistent collapse.  She returns to the clinic complaining of chronic cough, she does not have any sputum production, no fevers, chills. She reports slight worsening of her breathing with wheezing and chest congestion.  Outpatient  Encounter Prescriptions as of 01/10/2017  Medication Sig  . acetaminophen (TYLENOL) 325 MG tablet Take 2 tablets (650 mg total) by mouth every 6 (six) hours as needed for mild pain (or Fever >/= 101).  Marland Kitchen albuterol (PROVENTIL HFA;VENTOLIN HFA) 108 (90 Base) MCG/ACT inhaler Inhale 2 puffs into the lungs every 6 (six) hours as needed for wheezing or shortness of breath.  . budesonide-formoterol (SYMBICORT) 80-4.5 MCG/ACT inhaler Inhale 2 puffs into the lungs 2 (two) times daily.  . calcium-vitamin D (OSCAL WITH D) 500-200 MG-UNIT per tablet Take 1 tablet by mouth daily.  Marland Kitchen denosumab (PROLIA) 60 MG/ML SOLN injection Inject 60 mg into the skin every 6 (six) months. Administer in upper arm, thigh, or abdomen  . donepezil (ARICEPT) 10 MG tablet Take 0.5 tablets (5 mg total) by mouth 2 (two) times daily.  . hydrocortisone 2.5 % cream Apply topically 2 (two) times daily as needed.  . Multiple Vitamins-Minerals (MULTIVITAMIN WITH MINERALS) tablet Take 1 tablet by mouth daily.  . [DISCONTINUED] levofloxacin (LEVAQUIN) 500 MG tablet Take 1 tablet (500 mg total) by mouth daily.   No facility-administered encounter medications on file as of 01/10/2017.     Allergies as of 01/10/2017  . (No Known Allergies)    Past Medical History:  Diagnosis Date  . Complication of anesthesia    lot of confusion  . COPD (chronic obstructive pulmonary disease) ([employer])   . Dementia   . Osteoporosis     Past Surgical History:  Procedure Laterality Date  . ANKLE SURGERY     right  . IR GENERIC HISTORICAL  09/05/2016  IR THORACENTESIS ASP PLEURAL SPACE W/IMG GUIDE 09/05/2016 Docia Barrier, PA [facility]-INTER[facility]  . JOINT REPLACEMENT     right hip, left knee    Family History  Problem Relation Age of Onset  . Brain cancer Father   . Colon cancer Neg Hx   . Breast cancer Neg Hx     Social History   Social History  . Marital status: Widowed    Spouse name: N/A  . Number of children: 2  . Years of  education: N/A   Occupational History  . elderly    Social History Main Topics  . Smoking status: Never Smoker  . Smokeless tobacco: Never Used  . Alcohol use No  . Drug use: No  . Sexual activity: Not on file   Other Topics Concern  . Not on file   Social History Narrative   Lives by herself.   Since admission 09-2013, has a sitter M-F  8-5 pm. Son visit after 5 pm till she goes to sleep   [name] (son) [phone]   [name] (lives in [place])             Review of systems: Review of Systems  Constitutional: Negative for fever and chills.  HENT: Negative.   Eyes: Negative for blurred vision.  Respiratory: as per HPI  Cardiovascular: Negative for chest pain and palpitations.  Gastrointestinal: Negative for vomiting, diarrhea, blood per rectum. Genitourinary: Negative for dysuria, urgency, frequency and hematuria.  Musculoskeletal: Negative for myalgias, back pain and joint pain.  Skin: Negative for itching and rash.  Neurological: Negative for dizziness, tremors, focal weakness, seizures and loss of consciousness.  Endo/Heme/Allergies: Negative for environmental allergies.  Psychiatric/Behavioral: Negative for depression, suicidal ideas and hallucinations.  All other systems reviewed and are negative.  Physical Exam: Blood pressure 124/72, pulse 72, height 5\' 4"  (1.626 m), weight 122 lb (55.3 kg), SpO2 92 %. Gen:      No acute distress HEENT:  EOMI, sclera anicteric Neck:     No masses; no thyromegaly Lungs:    Reduced breath sounds on the left with dullness to percussion; normal respiratory effort CV:         Regular rate and rhythm; no murmurs Abd:      + bowel sounds; soft, non-tender; no palpable masses, no distension Ext:    No edema; adequate peripheral perfusion, Skin:      Warm and dry; no rash Neuro: alert and oriented x 3 Psych: normal mood and affect  Data Reviewed: Pleural studies 09/05/16 LDH 123 , protein 4 WBC 940, 57% lymphs, 25% eos, 18% monocyte  macrophage Culture- negative Cytology-lymphocytes and eosinophils, reactive mesothelial cells. No malignant cells identified.  CT abdomen pelvis 05/01/15-bibasilar atelectasis on lung images Chest x-ray 08/27/16-large left pleural effusion Chest x-ray 09/05/16-slight decrease in left effusion after thoracentesis CT chest 11/17/14- left lower lobe collapse, trace pleural effusion. 2 mm left upper lobe pulmonary nodule. Chest x-ray 01/08/17- hyperinflation, small left effusion. Persistent left lower lobe opacity/collapse. I had reviewed all images personally.  PFTs 10/30/11 FVC 2.83 [106%] FEV1 1.72 (94%) F/F 61 TLC 105% DLCO 101% Moderate obstruction with no bronchodilator response.  Immunology 11/08/16 CCP < 16, RA 18, ANA- negative  Assessment:  Left pleural effusion Left lower lobe collapse The effusion was exudative with lymphocytes and eosinophil predominance. This could have been parapneumonic but she does not have any evidence of active infection at present and she does not require additional antibiotics. This does not appear to be  reactivation of RA as her autoimmune labs are unremarkable.   I reviewed her recent imaging which shows the effusion has improved however she has persistent left lower lobe collapse. There is no obvious endobronchial lesion. I had a long discussion today with her son. Given her frailty, dementia we will do conservative management. I believe a bronchoscopy is high risk for her. As per her son she had a hard time tolerating the thoracentesis itself. We would focus on symptom management. There is a possibility that she may have a malignancy in that area but even then they would not want to be aggressive with treatment for that.  COPD Has COPD even though she is a nonsmoker. She does not appear to be using the Symbicort well due to issues with dexterity. I'll give her a nebulizer with brovana and pulmicort.   Plan/Recommendations: - Flutter valve, incentive  spirometer.  - Nebulizer with brovana, pulmicort, albuterol PRN. Stop all inhalers.  - Follow up within 6 months with repeat x-ray  More then 1/2 the time of the 40 min visit was spent in counseling and/or coordination of care with the patient and family.  Marshell Garfinkel MD [facility] Pager [phone] 01/10/2017, 9:47 AM  CC: Colon Branch, MD

## 2017-01-25 ENCOUNTER — Telehealth: Payer: Self-pay | Admitting: Internal Medicine

## 2017-01-25 NOTE — Telephone Encounter (Signed)
Attempted to call pt to schedule awv. Pt did not answer. Pt can schedule awv after 05/30/2017. Will try to call pt back at a later time.

## 2017-03-06 ENCOUNTER — Other Ambulatory Visit: Payer: Self-pay | Admitting: Internal Medicine

## 2017-03-23 ENCOUNTER — Emergency Department (HOSPITAL_COMMUNITY): Payer: Medicare Other

## 2017-03-23 ENCOUNTER — Emergency Department (HOSPITAL_COMMUNITY)
Admission: EM | Admit: 2017-03-23 | Discharge: 2017-03-24 | Disposition: A | Discharge status: Home / Self Care | Payer: Medicare Other | Attending: Emergency Medicine | Admitting: Emergency Medicine

## 2017-03-23 DIAGNOSIS — Y929 Unspecified place or not applicable: Secondary | ICD-10-CM | POA: Insufficient documentation

## 2017-03-23 DIAGNOSIS — Y9389 Activity, other specified: Secondary | ICD-10-CM | POA: Diagnosis not present

## 2017-03-23 DIAGNOSIS — Z96641 Presence of right artificial hip joint: Secondary | ICD-10-CM | POA: Insufficient documentation

## 2017-03-23 DIAGNOSIS — Z79899 Other long term (current) drug therapy: Secondary | ICD-10-CM | POA: Diagnosis not present

## 2017-03-23 DIAGNOSIS — S32591A Other specified fracture of right pubis, initial encounter for closed fracture: Secondary | ICD-10-CM

## 2017-03-23 DIAGNOSIS — X509XXA Other and unspecified overexertion or strenuous movements or postures, initial encounter: Secondary | ICD-10-CM | POA: Diagnosis not present

## 2017-03-23 DIAGNOSIS — S79911A Unspecified injury of right hip, initial encounter: Secondary | ICD-10-CM | POA: Diagnosis present

## 2017-03-23 DIAGNOSIS — S32501A Unspecified fracture of right pubis, initial encounter for closed fracture: Secondary | ICD-10-CM | POA: Diagnosis not present

## 2017-03-23 DIAGNOSIS — F0390 Unspecified dementia without behavioral disturbance: Secondary | ICD-10-CM | POA: Diagnosis not present

## 2017-03-23 DIAGNOSIS — Y998 Other external cause status: Secondary | ICD-10-CM | POA: Insufficient documentation

## 2017-03-23 DIAGNOSIS — S3993XA Unspecified injury of pelvis, initial encounter: Secondary | ICD-10-CM | POA: Diagnosis not present

## 2017-03-23 DIAGNOSIS — W19XXXA Unspecified fall, initial encounter: Secondary | ICD-10-CM

## 2017-03-23 DIAGNOSIS — J449 Chronic obstructive pulmonary disease, unspecified: Secondary | ICD-10-CM | POA: Insufficient documentation

## 2017-03-23 DIAGNOSIS — M25551 Pain in right hip: Secondary | ICD-10-CM

## 2017-03-23 MED ORDER — HYDROCODONE-ACETAMINOPHEN 5-325 MG PO TABS
1.0000 | ORAL_TABLET | Freq: Once | ORAL | Status: AC
  Administered 2017-03-23: 1 via ORAL
  Filled 2017-03-23: qty 1

## 2017-03-23 NOTE — ED Notes (Signed)
Bed: AD07 Expected date:  Expected time:  Means of arrival:  Comments: 83 yo fall; hip injury

## 2017-03-23 NOTE — ED Provider Notes (Addendum)
[facility] DEPT Provider Note   CSN: [id number] Arrival date & time: 03/23/17  1919     History   Chief Complaint Chief Complaint  Patient presents with  . Fall  . Hip Pain    HPI Donna Flowers is a 83 y.o. female.  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 Patient was at home with family members. She twisted turn to place a tray down and fell on her right hip. Patient's had a previous right hip replacement. Patient with complaint of hip pain. Patient did not hit her head. There was no loss of consciousness. Patient without any neck or back pain. Patient is not on blood thinners. Patient has a history of some mild dementia. Also significant for COPD. Patient in no acute distress here but does have a complaint of pain in the right hip area. Patient is comfortable when she doesn't move.      Past Medical History:  Diagnosis Date  . Complication of anesthesia    lot of confusion  . COPD (chronic obstructive pulmonary disease) ([place])   . Dementia   . Osteoporosis     Patient Active Problem List   Diagnosis Date Noted  . PCP NOTES >>>>>>>>>>. 03/03/2015  . Leg edema, right 10/20/2014  . UTI (urinary tract infection) 07/21/2014  . Dizziness and giddiness 07/21/2014  . Cough 09/22/2013  . Allergic rhinitis 05/01/2013  . Annual physical exam 03/04/2013  . Dementia 10/22/2011  . COPD (chronic obstructive pulmonary disease) ([place]) 03/28/2007  . OSTEOARTHRITIS 03/28/2007  . Osteoporosis 03/28/2007  . URINARY INCONTINENCE 03/28/2007    Past Surgical History:  Procedure Laterality Date  . ANKLE SURGERY     right  . IR GENERIC HISTORICAL  09/05/2016   IR THORACENTESIS ASP PLEURAL SPACE W/IMG GUIDE 09/05/2016 Docia Barrier, PA [facility]  . JOINT REPLACEMENT     right hip, left knee    OB History    No data available       Home Medications    Prior to Admission medications   Medication Sig Start Date End Date Taking? Authorizing Provider    AMBULATORY NON FORMULARY MEDICATION Medication Name: incentive spirometer 01/10/17  Yes Mannam, Praveen, MD  arformoterol (BROVANA) 15 MCG/2ML NEBU Take 2 mLs (15 mcg total) by nebulization 2 (two) times daily. Dx: J44.9 01/10/17  Yes Mannam, Praveen, MD  budesonide (PULMICORT) 0.25 MG/2ML nebulizer solution Take 2 mLs (0.25 mg total) by nebulization 2 (two) times daily. Dx:J44.9 01/10/17 01/10/18 Yes Mannam, Praveen, MD  calcium-vitamin D (OSCAL WITH D) 500-200 MG-UNIT per tablet Take 1 tablet by mouth daily.   Yes [provider]  denosumab (PROLIA) 60 MG/ML SOLN injection Inject 60 mg into the skin every 6 (six) months. Administer in upper arm, thigh, or abdomen   Yes [provider]  donepezil (ARICEPT) 10 MG tablet Take 0.5 tablets (5 mg total) by mouth 2 (two) times daily. 03/06/17  Yes Paz, Alda Berthold, MD  Multiple Vitamins-Minerals (MULTIVITAMIN WITH MINERALS) tablet Take 1 tablet by mouth daily.   Yes [provider]  Respiratory Therapy Supplies (FLUTTER) DEVI Use as directed 01/10/17  Yes Mannam, Praveen, MD  acetaminophen (TYLENOL) 325 MG tablet Take 2 tablets (650 mg total) by mouth every 6 (six) hours as needed for mild pain (or Fever >/= 101). Patient not taking: Reported on 03/23/2017 09/28/13   Kinnie Feil, MD  albuterol (PROVENTIL) (2.5 MG/3ML) 0.083% nebulizer solution Take 3 mLs (2.5 mg total) by nebulization every  6 (six) hours as needed for wheezing or shortness of breath. Dx: J44.9 Patient not taking: Reported on 03/23/2017 01/10/17   Marshell Garfinkel, MD  HYDROcodone-acetaminophen (NORCO/VICODIN) 5-325 MG tablet Take 1-2 tablets by mouth every 6 (six) hours as needed for moderate pain. 03/24/17   Fredia Sorrow, MD    Family History Family History  Problem Relation Age of Onset  . Brain cancer Father   . Colon cancer Neg Hx   . Breast cancer Neg Hx     Social History Social History  Substance Use Topics  . Smoking status: Never Smoker  . Smokeless  tobacco: Never Used  . Alcohol use No     Allergies   Patient has no known allergies.   Review of Systems Review of Systems  Unable to perform ROS: Dementia     Physical Exam Updated Vital Signs BP (!) 144/59 (BP Location: Left Arm)   Pulse 87   Temp 97.8 F (36.6 C) (Oral)   Resp 18   SpO2 93%   Physical Exam  Constitutional: She is oriented to person, place, and time. She appears well-developed and well-nourished. No distress.  HENT:  Head: Normocephalic and atraumatic.  Mouth/Throat: Oropharynx is clear and moist.  Eyes: Pupils are equal, round, and reactive to light. Conjunctivae and EOM are normal.  Neck: Normal range of motion. Neck supple.  No posterior tenderness to cervical area  Cardiovascular: Normal rate, regular rhythm and normal heart sounds.   Pulmonary/Chest: Effort normal and breath sounds normal. No respiratory distress.  Abdominal: Soft. Bowel sounds are normal. There is no tenderness.  Musculoskeletal: Normal range of motion. She exhibits tenderness. She exhibits no deformity.  Right leg is slightly shorter than left. Tenderness to palpation around the right hip area. No obvious deformity. Distantly reasonable Refill. No other extremity pain. Some decreased range of motion of the right leg due to hip pain.  Neurological: She is alert and oriented to person, place, and time. No cranial nerve deficit or sensory deficit. She exhibits normal muscle tone. Coordination normal.  Skin: Skin is warm.  Nursing note and vitals reviewed.    ED Treatments / Results  Labs (all labs ordered are listed, but only abnormal results are displayed) Labs Reviewed  CBC WITH DIFFERENTIAL/PLATELET  BASIC METABOLIC PANEL    EKG  EKG Interpretation None       Radiology Ct Pelvis Wo Contrast  Result Date: 03/23/2017 CLINICAL DATA:  Status post fall, with right hip pain. Initial encounter. EXAM: CT PELVIS WITHOUT CONTRAST TECHNIQUE: Multidetector CT imaging of the  pelvis was performed following the standard protocol without intravenous contrast. COMPARISON:  CT of the abdomen and pelvis performed 05/01/2015 FINDINGS: Urinary Tract: The visualized portions of the kidneys are unremarkable. Anterior bladder wall thickening may reflect cystitis, though underlying mass cannot be excluded. Bowel: The appendix is unremarkable in appearance. Scattered diverticulosis is noted along the sigmoid colon, without evidence of diverticulitis. Visualized small bowel loops are grossly unremarkable. Vascular/Lymphatic: Diffuse calcification is seen along the abdominal aorta and its branches. The abdominal aorta is otherwise grossly unremarkable. The inferior vena cava is grossly unremarkable. No retroperitoneal lymphadenopathy is seen. No pelvic sidewall lymphadenopathy is identified. Reproductive: The patient is status post hysterectomy. No suspicious adnexal masses are seen. Other:  Soft tissue hemorrhage is noted at the space of Retzius. Musculoskeletal: There is a mildly displaced acute fracture along the right side of the pubic symphysis, with slight comminution. The patient's right hip arthroplasty is incompletely imaged, but appears  grossly unremarkable. No additional fractures are seen. Degenerative change noted along the lower lumbar spine, with vacuum phenomenon and endplate sclerotic change. Underlying facet disease is noted. There is grade 1 anterolisthesis of L5 on S1. IMPRESSION: 1. Mildly displaced acute fracture along the right pubic symphysis, with slight comminution. 2. Soft tissue hemorrhage at the space of Retzius. 3. Anterior bladder wall thickening may reflect cystitis. Underlying mass cannot be excluded. Would correlate with urinalysis results; further evaluation could be considered as deemed clinically appropriate. 4. Diffuse aortic atherosclerosis. 5. Scattered diverticulosis along the sigmoid colon, without evidence of diverticulitis. Electronically Signed   By:  Garald Balding M.D.   On: 03/23/2017 23:58   Ct Hip Right Wo Contrast  Result Date: 03/24/2017 CLINICAL DATA:  Status post fall, with right hip pain. Initial encounter. EXAM: CT OF THE RIGHT HIP WITHOUT CONTRAST TECHNIQUE: Multidetector CT imaging of the right hip was performed according to the standard protocol. Multiplanar CT image reconstructions were also generated. COMPARISON:  Right femur radiographs performed 01/27/2012 FINDINGS: Bones/Joint/Cartilage There is a mildly comminuted fracture involving the right side of the pubic symphysis, with mild displacement. No additional fractures are seen. The patient's right hip arthroplasty appears intact, with associated cerclage wires. There appears to be mildly increased lucency about the proximal aspect of the prosthesis, which may reflect some degree of chronic loosening. Additional concrete is noted at the distal femoral stem, since the right femur radiographs from 2013. Ligaments Suboptimally assessed by CT. Muscles and Tendons There is partial atrophy of the vastus lateralis musculature and lateral gluteus musculature. Visualized tendons are grossly unremarkable. Soft tissues Scattered vascular calcifications are seen. Soft tissue injury is seen tracking along the right pelvic sidewall and at the space of Retzius. IMPRESSION: 1. Mildly comminuted fracture involving the right side of the pubic symphysis, with mild displacement. 2. Soft tissue injury along the right pelvic sidewall and at the space of Retzius. 3. Mildly increased lucency about the proximal aspect of the prosthesis since 2013 may reflect some degree of chronic loosening. Right hip arthroplasty otherwise grossly intact. 4. Scattered vascular calcifications seen. 5. Partial atrophy of the right vastus lateralis and lateral gluteus musculature. Electronically Signed   By: Garald Balding M.D.   On: 03/24/2017 00:17   Dg Hip Unilat W Or Wo Pelvis 2-3 Views Right  Result Date:  03/23/2017 CLINICAL DATA:  83 year old female status post fall onto right hip with pain. Prior hip replacement. EXAM: DG HIP (WITH OR WITHOUT PELVIS) 2-3V RIGHT COMPARISON:  CT Abdomen and Pelvis 05/01/2015. Right knee series 818 2013. FINDINGS: Osteopenia. Fractures of the right inferior and superior pubic ramus appear to be chronic. The pelvis elsewhere appears intact. Grossly intact proximal left femur. Partially visible advanced lower lumbar disc and endplate degeneration. Chronic right hip arthroplasty with elongated, cemented right femoral stem and two cerclage wires. The right hip components appear intact and normally aligned. Chronic lucency cephalad of the acetabular component is stable. No definite proximal right femur fracture. IMPRESSION: 1. Right superior and inferior pubic rami fractures appear chronic. 2. Chronic right hip arthroplasty appears stable. 3.  No acute fracture or dislocation identified. Electronically Signed   By: Genevie Ann M.D.   On: 03/23/2017 20:49    Procedures Procedures (including critical care time)  Medications Ordered in ED Medications  HYDROcodone-acetaminophen (NORCO/VICODIN) 5-325 MG per tablet 1 tablet (1 tablet Oral Given 03/23/17 2249)     Initial Impression / Assessment and Plan / ED Course  I have reviewed  the triage vital signs and the nursing notes.  Pertinent labs & imaging results that were available during my care of the patient were reviewed by me and considered in my medical decision making (see chart for details).    Patient's x-rays right hip and pelvis without evidence of any acute injury but there is evidence of an old or remote pubic rami fractures. Was family was not aware of. Patient's hip replacement and hardware appears normal and no evidence of any hip fracture. But due to the tenderness and the questionable pubic rami fractures have ordered CT of the pelvis and CT of the right hip to rule out any acute injuries with certainty. Patient  treated for pain with hydrocodone. Patient's basic labs without any significant abnormalities.  Correction labs are been ordered but are not back yet mass effect have not been drawn. If patient's CT of the hip and pelvis area has no acute bony injuries. Last not be needed.  Patient CT shows an acute fracture to the right pubic area ramus area. No evidence of any hip fracture. Discussed with family. Patient has walker at home has bedside commode. Followed by [facility] orthopedics. Will do pain control and have her follow-up with [facility] orthopedics. Family would prefer for patient to go home. She has bedside commode walker and is followed closely by [facility] orthopedics. Will do pain control.  Final Clinical Impressions(s) / ED Diagnoses   Final diagnoses:  Fall, initial encounter  Right hip pain  Pubic ramus fracture, right, closed, initial encounter (HCC)    New Prescriptions New Prescriptions   HYDROCODONE-ACETAMINOPHEN (NORCO/VICODIN) 5-325 MG TABLET    Take 1-2 tablets by mouth every 6 (six) hours as needed for moderate pain.     Fredia Sorrow, MD 03/24/17 0028  Labs done no significant abnormalities other than leukocytosis.    Fredia Sorrow, MD 03/24/17 [phone]

## 2017-03-23 NOTE — ED Notes (Signed)
Bed: WA19 Expected date:  Expected time:  Means of arrival:  Comments: 

## 2017-03-23 NOTE — ED Triage Notes (Signed)
Pt was carrying tray when she twisted/turned to place tray down and pt fell on right hip. Pt has previous non surgical spiral fx to hip 28yrs ago as well as hip replacement.

## 2017-03-23 NOTE — ED Notes (Signed)
Bed: [facility] Expected date:  Expected time:  Means of arrival:  Comments: Room 19

## 2017-03-24 LAB — BASIC METABOLIC PANEL
ANION GAP: 8 (ref 5–15)
BUN: 19 mg/dL (ref 6–20)
CO2: 28 mmol/L (ref 22–32)
Calcium: 9.4 mg/dL (ref 8.9–10.3)
Chloride: 107 mmol/L (ref 101–111)
Creatinine, Ser: 0.92 mg/dL (ref 0.44–1.00)
GFR calc Af Amer: >60 mL/min (ref >60)
GFR, EST NON AFRICAN AMERICAN: 55 mL/min — AB (ref >60)
GLUCOSE: 118 mg/dL — AB (ref 65–99)
POTASSIUM: 4.3 mmol/L (ref 3.5–5.1)
SODIUM: 143 mmol/L (ref 135–145)

## 2017-03-24 LAB — CBC WITH DIFFERENTIAL/PLATELET
Basophils Absolute: 0 10*3/uL (ref 0.0–0.1)
Basophils Relative: 0 %
EOS PCT: 1 %
Eosinophils Absolute: 0.2 10*3/uL (ref 0.0–0.7)
HEMATOCRIT: 43.5 % (ref 36.0–46.0)
Hemoglobin: 14.1 g/dL (ref 12.0–15.0)
LYMPHS ABS: 1.3 10*3/uL (ref 0.7–4.0)
LYMPHS PCT: 11 %
MCH: 28.8 pg (ref 26.0–34.0)
MCHC: 32.4 g/dL (ref 30.0–36.0)
MCV: 88.8 fL (ref 78.0–100.0)
MONO ABS: 0.7 10*3/uL (ref 0.1–1.0)
Monocytes Relative: 6 %
NEUTROS ABS: 9.9 10*3/uL — AB (ref 1.7–7.7)
Neutrophils Relative %: 82 %
PLATELETS: 202 10*3/uL (ref 150–400)
RBC: 4.9 MIL/uL (ref 3.87–5.11)
RDW: 15.6 % — ABNORMAL HIGH (ref 11.5–15.5)
WBC: 12.1 10*3/uL — ABNORMAL HIGH (ref 4.0–10.5)

## 2017-03-24 MED ORDER — HYDROCODONE-ACETAMINOPHEN 5-325 MG PO TABS: 1.0000 | ORAL_TABLET | Freq: Four times a day (QID) | ORAL | 0 refills | Status: DC | PRN

## 2017-03-24 NOTE — Discharge Instructions (Signed)
Take pain medicine hydrocodone as directed and as needed for pain. Follow-up with [facility] orthopedics call on Monday. Use walker at home. Return for any new or worse symptoms.

## 2017-03-25 ENCOUNTER — Other Ambulatory Visit: Payer: Self-pay | Admitting: Internal Medicine

## 2017-03-28 DIAGNOSIS — S32591A Other specified fracture of right pubis, initial encounter for closed fracture: Secondary | ICD-10-CM | POA: Diagnosis not present

## 2017-04-02 ENCOUNTER — Telehealth: Payer: Self-pay | Admitting: Internal Medicine

## 2017-04-02 NOTE — Telephone Encounter (Signed)
Spoke with Donna Flowers's caregiver. Caregiver stated that patient was not available and to give Donna Flowers's son Donna Flowers) a call regarding AWV. Called Donna Flowers at [phone] and left msg regarding scheduling an appt. SF

## 2017-04-17 ENCOUNTER — Ambulatory Visit: Payer: PRIVATE HEALTH INSURANCE | Admitting: Internal Medicine

## 2017-04-20 ENCOUNTER — Other Ambulatory Visit: Payer: Self-pay | Admitting: Pulmonary Disease

## 2017-04-24 DIAGNOSIS — S32591D Other specified fracture of right pubis, subsequent encounter for fracture with routine healing: Secondary | ICD-10-CM | POA: Diagnosis not present

## 2017-04-29 ENCOUNTER — Ambulatory Visit (INDEPENDENT_AMBULATORY_CARE_PROVIDER_SITE_OTHER): Payer: Medicare Other | Admitting: Internal Medicine

## 2017-04-29 ENCOUNTER — Encounter: Payer: Self-pay | Admitting: Internal Medicine

## 2017-04-29 VITALS — BP 124/66 | HR 77 | Resp 14 | Ht 64.0 in | Wt 126.0 lb

## 2017-04-29 DIAGNOSIS — Z23 Encounter for immunization: Secondary | ICD-10-CM

## 2017-04-29 DIAGNOSIS — N3090 Cystitis, unspecified without hematuria: Secondary | ICD-10-CM

## 2017-04-29 DIAGNOSIS — G308 Other Alzheimer's disease: Secondary | ICD-10-CM

## 2017-04-29 DIAGNOSIS — M810 Age-related osteoporosis without current pathological fracture: Secondary | ICD-10-CM

## 2017-04-29 DIAGNOSIS — F0280 Dementia in other diseases classified elsewhere without behavioral disturbance: Secondary | ICD-10-CM

## 2017-04-29 MED ORDER — DENOSUMAB 60 MG/ML ~~LOC~~ SOLN
60.0000 mg | Freq: Once | SUBCUTANEOUS | Status: AC
  Administered 2017-04-29: 60 mg via SUBCUTANEOUS

## 2017-04-29 NOTE — Progress Notes (Signed)
Pre visit review using our clinic review tool, if applicable. No additional management support is needed unless otherwise documented below in the visit note. 

## 2017-04-29 NOTE — Patient Instructions (Addendum)
GO TO THE LAB : Provide a urine sample if possible   GO TO THE FRONT DESK Schedule your next appointment for a checkup in 6 months

## 2017-04-29 NOTE — Progress Notes (Signed)
Subjective:    Patient ID: Donna Flowers, female    DOB: 1927/09/13, 83 y.o.   MRN: [id number]  DOS:  04/29/2017 Type of visit - description : rov Interval history: Since the last office visit had a fall, went to the ER, had pelvis ramus and  femur fractures, was treated conservatively. Since then has seen orthopedic doctor and is feeling better. Initially required for hydrocodone but caused MS changes, then she took Tylenol and now does not require any pain medicine. Workup in the ER show thick bladder, possibly UTI, no symptoms were reported at the time.  Review of Systems  Dementia, unable to do review of systems.   The patient continued to be taking care of at home by herson, actually the fall happened  when [name] was  was at home w/ her  All her needs seems to be met at this time, the patient's son believes she is safe the few moments she is by herself.  Past Medical History:  Diagnosis Date  . Complication of anesthesia    lot of confusion  . COPD (chronic obstructive pulmonary disease) ([place])   . Dementia   . Osteoporosis     Past Surgical History:  Procedure Laterality Date  . ANKLE SURGERY     right  . IR GENERIC HISTORICAL  09/05/2016   IR THORACENTESIS ASP PLEURAL SPACE W/IMG GUIDE 09/05/2016 Docia Barrier, PA [facility]  . JOINT REPLACEMENT     right hip, left knee    Social History   Socioeconomic History  . Marital status: Widowed    Spouse name: Not on file  . Number of children: 2  . Years of education: Not on file  . Highest education level: Not on file  Social Needs  . Financial resource strain: Not on file  . Food insecurity - worry: Not on file  . Food insecurity - inability: Not on file  . Transportation needs - medical: Not on file  . Transportation needs - non-medical: Not on file  Occupational History  . Occupation: elderly  Tobacco Use  . Smoking status: Never Smoker  . Smokeless tobacco: Never Used  Substance and Sexual  Activity  . Alcohol use: No  . Drug use: No  . Sexual activity: Not on file  Other Topics Concern  . Not on file  Social History Narrative   Lives by herself.   Since admission 09-2013, has a sitter M-F  8-5 pm. Son visit after 5 pm till she goes to sleep   When awake somebody is with her 85% of the time    [name] (son) [phone]   [name] (lives in [place])            Allergies as of 04/29/2017   No Known Allergies     Medication List        Accurate as of 04/29/17 11:59 PM. Always use your most recent med list.          acetaminophen 325 MG tablet Commonly known as:  TYLENOL Take 2 tablets (650 mg total) by mouth every 6 (six) hours as needed for mild pain (or Fever >/= 101).   albuterol (2.5 MG/3ML) 0.083% nebulizer solution Commonly known as:  PROVENTIL Take 3 mLs (2.5 mg total) by nebulization every 6 (six) hours as needed for wheezing or shortness of breath. Dx: J44.9   AMBULATORY NON FORMULARY MEDICATION Medication Name: incentive spirometer   arformoterol 15 MCG/2ML Nebu Commonly known as:  BROVANA Take  2 mLs (15 mcg total) by nebulization 2 (two) times daily. Dx: J44.9   budesonide 0.25 MG/2ML nebulizer solution Commonly known as:  PULMICORT TAKE 2 MLS (0.25 MG TOTAL) BY NEBULIZATION 2 (TWO) TIMES DAILY. DX:J44.9   calcium-vitamin D 500-200 MG-UNIT tablet Commonly known as:  OSCAL WITH D Take 1 tablet by mouth daily.   denosumab 60 MG/ML Soln injection Commonly known as:  PROLIA Inject 60 mg into the skin every 6 (six) months. Administer in upper arm, thigh, or abdomen   donepezil 10 MG tablet Commonly known as:  ARICEPT Take 0.5 tablets (5 mg total) by mouth 2 (two) times daily.   FLUTTER Devi Use as directed   multivitamin with minerals tablet Take 1 tablet by mouth daily.          Objective:   Physical Exam BP 124/66 (BP Location: Left Arm, Patient Position: Sitting, Cuff Size: Small)   Pulse 77   Resp 14   Ht _0  (1.626 m)   Wt  126 lb (57.2 kg)   SpO2 97%   BMI 21.63 kg/m  General:   Well developed, well nourished . NAD.  HEENT:  Normocephalic . Face symmetric, atraumatic Lungs:  CTA B Normal respiratory effort, no intercostal retractions, no accessory muscle use. Heart: RRR,  no murmur.  No pretibial edema bilaterally  Skin: Not pale. Not jaundice Neurologic:  alert & pleasantly demented, speaks very little, hardly follows commands, no distress, please send. Psych--  No anxious or depressed appearing.      Assessment & Plan:  Assessment  DJD, used to see [facility] COPD -- on meds prn Osteoporosis - s/p forteo before, saw Dr Cruzita Lederer 2015 rec prolia   T score 2013 -3.6, forearm, ; -3.2 femur (Lunar prodigy advance) T score 02-2016 -3.9, forearm, -3.2 femur (Lunar iDXA)  (matching is different, difficult to compare results). Urinary incontinence Dementia--- aricept started ~ 2011 Skin ca-- SCC, does not see derm regularly  Left leg edema: (-) [place] ~ 10-2014 Left pleural effusion, LLL collapse.  No further workup per pulmonary note 01-2017  PLAN: Pelvic and R femur FX: Treated conservatively by portal, seems to be doing better.  Able to use a walker. Pulmonary: Note from pulmonary reviewed, had a pleural fusion & a LLL collapse, no further w/u planned Osteoporosis: Prolia today CT shows thick bladder, will check a UA urine culture if possible. Dementia: Seems stable Labs 03-2017: Normal kidney function, no anemia, WBC slightly elevated in the context of a recent fall and stress. Dementia: The patient soon feels that she is safe at home however he is thinking about transferring her to a facility, pros and cons discussed, initially she may have some sundowning but she should add up within 2-3 weeks.  It is very hard decision for [name] to take however he has been doing a great job for many years, he is praised, I will support him in his decisions. Primary  care: Flu shot today RTC 6 months.  Today, I  spent more than 28   min with the patient's son: >50% of the time counseling regards disposition given chronic dementia

## 2017-04-30 NOTE — Assessment & Plan Note (Signed)
Pelvic and R femur FX: Treated conservatively by portal, seems to be doing better.  Able to use a walker. Pulmonary: Note from pulmonary reviewed, had a pleural fusion & a LLL collapse, no further w/u planned Osteoporosis: Prolia today CT shows thick bladder, will check a UA urine culture if possible. Dementia: Seems stable Labs 03-2017: Normal kidney function, no anemia, WBC slightly elevated in the context of a recent fall and stress. Dementia: The patient soon feels that she is safe at home however he is thinking about transferring her to a facility, pros and cons discussed, initially she may have some sundowning but she should add up within 2-3 weeks.  It is very hard decision for [name] to take however he has been doing a great job for many years, he is praised, I will support him in his decisions. Primary  care: Flu shot today RTC 6 months.

## 2017-05-07 ENCOUNTER — Other Ambulatory Visit: Payer: Medicare Other

## 2017-05-07 DIAGNOSIS — N3090 Cystitis, unspecified without hematuria: Secondary | ICD-10-CM | POA: Diagnosis not present

## 2017-05-07 LAB — URINALYSIS, ROUTINE W REFLEX MICROSCOPIC
Bilirubin Urine: NEGATIVE
Ketones, ur: NEGATIVE
Nitrite: NEGATIVE
Specific Gravity, Urine: 1.02 (ref 1.000–1.030)
Total Protein, Urine: NEGATIVE
URINE GLUCOSE: NEGATIVE
UROBILINOGEN UA: 0.2 (ref 0.0–1.0)
pH: 6 (ref 5.0–8.0)

## 2017-05-10 LAB — URINE CULTURE
MICRO NUMBER:: 81330102
SPECIMEN QUALITY: ADEQUATE

## 2017-05-10 MED ORDER — NITROFURANTOIN MONOHYD MACRO 100 MG PO CAPS: 100.0000 mg | ORAL_CAPSULE | Freq: Two times a day (BID) | ORAL | 0 refills | Status: DC

## 2017-05-10 NOTE — Addendum Note (Signed)
Addended byDamita Dunnings D on: 05/10/2017 04:01 PM   Modules accepted: Orders

## 2017-05-16 DIAGNOSIS — S32591D Other specified fracture of right pubis, subsequent encounter for fracture with routine healing: Secondary | ICD-10-CM | POA: Diagnosis not present

## 2017-05-23 ENCOUNTER — Other Ambulatory Visit: Payer: Self-pay

## 2017-05-23 ENCOUNTER — Telehealth: Payer: Self-pay | Admitting: Internal Medicine

## 2017-05-23 MED ORDER — DONEPEZIL HCL 10 MG PO TABS: 5.0000 mg | ORAL_TABLET | Freq: Two times a day (BID) | ORAL | 1 refills | Status: AC

## 2017-05-23 NOTE — Telephone Encounter (Signed)
Copied from [place]. Topic: Quick Communication - See Telephone Encounter >> May 23, 2017  8:25 AM Burnis Medin, NT wrote: CRM for notification. See Telephone encounter for: Pt. Son called to get his mother a medication refill for donepezil (ARICEPT) 10 MG tablet. Pt uses [facility] on [address].  05/23/17.

## 2017-05-23 NOTE — Telephone Encounter (Signed)
Left message for son [name] to call back with confirmation of which pharmacy is going to be used for refill.

## 2017-05-24 NOTE — Telephone Encounter (Signed)
Noted Donepezil was refilled 12/13 per office

## 2017-06-07 ENCOUNTER — Emergency Department (HOSPITAL_COMMUNITY)
Admission: EM | Admit: 2017-06-07 | Discharge: 2017-06-07 | Disposition: A | Discharge status: Home / Self Care | Payer: Medicare Other | Attending: Emergency Medicine | Admitting: Emergency Medicine

## 2017-06-07 ENCOUNTER — Emergency Department (HOSPITAL_COMMUNITY): Payer: Medicare Other

## 2017-06-07 ENCOUNTER — Other Ambulatory Visit: Payer: Self-pay

## 2017-06-07 DIAGNOSIS — Z79899 Other long term (current) drug therapy: Secondary | ICD-10-CM | POA: Insufficient documentation

## 2017-06-07 DIAGNOSIS — Z96652 Presence of left artificial knee joint: Secondary | ICD-10-CM | POA: Diagnosis not present

## 2017-06-07 DIAGNOSIS — R4182 Altered mental status, unspecified: Secondary | ICD-10-CM | POA: Diagnosis not present

## 2017-06-07 DIAGNOSIS — W19XXXA Unspecified fall, initial encounter: Secondary | ICD-10-CM

## 2017-06-07 DIAGNOSIS — J449 Chronic obstructive pulmonary disease, unspecified: Secondary | ICD-10-CM | POA: Insufficient documentation

## 2017-06-07 DIAGNOSIS — S0990XA Unspecified injury of head, initial encounter: Secondary | ICD-10-CM | POA: Diagnosis not present

## 2017-06-07 DIAGNOSIS — S42342A Displaced spiral fracture of shaft of humerus, left arm, initial encounter for closed fracture: Secondary | ICD-10-CM | POA: Insufficient documentation

## 2017-06-07 DIAGNOSIS — Y9389 Activity, other specified: Secondary | ICD-10-CM | POA: Insufficient documentation

## 2017-06-07 DIAGNOSIS — S42302A Unspecified fracture of shaft of humerus, left arm, initial encounter for closed fracture: Secondary | ICD-10-CM | POA: Diagnosis not present

## 2017-06-07 DIAGNOSIS — F0390 Unspecified dementia without behavioral disturbance: Secondary | ICD-10-CM | POA: Insufficient documentation

## 2017-06-07 DIAGNOSIS — R220 Localized swelling, mass and lump, head: Secondary | ICD-10-CM | POA: Diagnosis not present

## 2017-06-07 DIAGNOSIS — S42295A Other nondisplaced fracture of upper end of left humerus, initial encounter for closed fracture: Secondary | ICD-10-CM | POA: Diagnosis not present

## 2017-06-07 DIAGNOSIS — S0081XA Abrasion of other part of head, initial encounter: Secondary | ICD-10-CM | POA: Insufficient documentation

## 2017-06-07 DIAGNOSIS — M25551 Pain in right hip: Secondary | ICD-10-CM

## 2017-06-07 DIAGNOSIS — S0181XA Laceration without foreign body of other part of head, initial encounter: Secondary | ICD-10-CM | POA: Diagnosis not present

## 2017-06-07 DIAGNOSIS — Y92008 Other place in unspecified non-institutional (private) residence as the place of occurrence of the external cause: Secondary | ICD-10-CM | POA: Insufficient documentation

## 2017-06-07 DIAGNOSIS — Z23 Encounter for immunization: Secondary | ICD-10-CM | POA: Diagnosis not present

## 2017-06-07 DIAGNOSIS — Y998 Other external cause status: Secondary | ICD-10-CM | POA: Diagnosis not present

## 2017-06-07 DIAGNOSIS — S3992XA Unspecified injury of lower back, initial encounter: Secondary | ICD-10-CM | POA: Diagnosis not present

## 2017-06-07 DIAGNOSIS — M25552 Pain in left hip: Secondary | ICD-10-CM | POA: Diagnosis not present

## 2017-06-07 DIAGNOSIS — S42292A Other displaced fracture of upper end of left humerus, initial encounter for closed fracture: Secondary | ICD-10-CM

## 2017-06-07 DIAGNOSIS — S4992XA Unspecified injury of left shoulder and upper arm, initial encounter: Secondary | ICD-10-CM | POA: Diagnosis present

## 2017-06-07 DIAGNOSIS — S199XXA Unspecified injury of neck, initial encounter: Secondary | ICD-10-CM | POA: Diagnosis not present

## 2017-06-07 DIAGNOSIS — Z96641 Presence of right artificial hip joint: Secondary | ICD-10-CM | POA: Diagnosis not present

## 2017-06-07 DIAGNOSIS — S79911A Unspecified injury of right hip, initial encounter: Secondary | ICD-10-CM | POA: Diagnosis not present

## 2017-06-07 DIAGNOSIS — S42202A Unspecified fracture of upper end of left humerus, initial encounter for closed fracture: Secondary | ICD-10-CM | POA: Diagnosis not present

## 2017-06-07 DIAGNOSIS — S299XXA Unspecified injury of thorax, initial encounter: Secondary | ICD-10-CM | POA: Diagnosis not present

## 2017-06-07 DIAGNOSIS — W010XXA Fall on same level from slipping, tripping and stumbling without subsequent striking against object, initial encounter: Secondary | ICD-10-CM | POA: Diagnosis not present

## 2017-06-07 DIAGNOSIS — G8911 Acute pain due to trauma: Secondary | ICD-10-CM | POA: Diagnosis not present

## 2017-06-07 LAB — BASIC METABOLIC PANEL
Anion gap: 10 (ref 5–15)
BUN: 20 mg/dL (ref 6–20)
CALCIUM: 9.3 mg/dL (ref 8.9–10.3)
CO2: 22 mmol/L (ref 22–32)
Chloride: 103 mmol/L (ref 101–111)
Creatinine, Ser: 0.93 mg/dL (ref 0.44–1.00)
GFR calc Af Amer: >60 mL/min (ref >60)
GFR, EST NON AFRICAN AMERICAN: 54 mL/min — AB (ref >60)
GLUCOSE: 155 mg/dL — AB (ref 65–99)
Potassium: 4.3 mmol/L (ref 3.5–5.1)
Sodium: 135 mmol/L (ref 135–145)

## 2017-06-07 LAB — CBC WITH DIFFERENTIAL/PLATELET
BASOS ABS: 0 10*3/uL (ref 0.0–0.1)
BASOS PCT: 0 %
EOS PCT: 0 %
Eosinophils Absolute: 0 10*3/uL (ref 0.0–0.7)
HCT: 39.5 % (ref 36.0–46.0)
Hemoglobin: 13.1 g/dL (ref 12.0–15.0)
LYMPHS PCT: 7 %
Lymphs Abs: 1 10*3/uL (ref 0.7–4.0)
MCH: 29.1 pg (ref 26.0–34.0)
MCHC: 33.2 g/dL (ref 30.0–36.0)
MCV: 87.8 fL (ref 78.0–100.0)
MONO ABS: 0.5 10*3/uL (ref 0.1–1.0)
MONOS PCT: 3 %
Neutro Abs: 13.4 10*3/uL — ABNORMAL HIGH (ref 1.7–7.7)
Neutrophils Relative %: 90 %
PLATELETS: 250 10*3/uL (ref 150–400)
RBC: 4.5 MIL/uL (ref 3.87–5.11)
RDW: 14.5 % (ref 11.5–15.5)
WBC: 15 10*3/uL — ABNORMAL HIGH (ref 4.0–10.5)

## 2017-06-07 MED ORDER — FENTANYL CITRATE (PF) 100 MCG/2ML IJ SOLN
25.0000 ug | Freq: Once | INTRAMUSCULAR | Status: AC
  Administered 2017-06-07: 25 ug via INTRAVENOUS
  Filled 2017-06-07: qty 2

## 2017-06-07 MED ORDER — ACETAMINOPHEN 325 MG PO TABS: 650.0000 mg | ORAL_TABLET | Freq: Four times a day (QID) | ORAL | 0 refills | Status: AC | PRN

## 2017-06-07 MED ORDER — ACETAMINOPHEN 325 MG PO TABS
650.0000 mg | ORAL_TABLET | Freq: Once | ORAL | Status: AC
  Administered 2017-06-07: 650 mg via ORAL
  Filled 2017-06-07: qty 2

## 2017-06-07 MED ORDER — TETANUS-DIPHTH-ACELL PERTUSSIS 5-2.5-18.5 LF-MCG/0.5 IM SUSP
0.5000 mL | Freq: Once | INTRAMUSCULAR | Status: AC
  Administered 2017-06-07: 0.5 mL via INTRAMUSCULAR
  Filled 2017-06-07: qty 0.5

## 2017-06-07 MED ORDER — BACITRACIN ZINC 500 UNIT/GM EX OINT: 1.0000 "application " | TOPICAL_OINTMENT | Freq: Two times a day (BID) | CUTANEOUS | 1 refills | Status: AC

## 2017-06-07 MED ORDER — BACITRACIN ZINC 500 UNIT/GM EX OINT
1.0000 "application " | TOPICAL_OINTMENT | Freq: Two times a day (BID) | CUTANEOUS | Status: DC
  Filled 2017-06-07: qty 0.9

## 2017-06-07 NOTE — ED Notes (Signed)
CALLED [facility] to take pt home

## 2017-06-07 NOTE — ED Provider Notes (Signed)
[facility] DEPART[facility] Provider Note   CSN: [id number] Arrival date & time: 06/07/17  1428     History   Chief Complaint Chief Complaint  Patient presents with  . Fall    HPI Donna Flowers is a 83 y.o. female.  Level V Caveat: Dementia   Donna Flowers is a 83 y.o. Female who presents to the ED from home after a trip and fall.  Patient lives at home with her son.  He reports he was not watching her for a second and she stood up on her own and started walking and tripped in the hallway falling onto the ground.  No loss of consciousness.  He reports she was lying on her back after the fall.  She is complaining of left arm pain and right hip pain.  She is not on anticoagulants.  Son reports that she has been acting at her baseline recently.  She has dementia and is typically confused.  She has had no fevers or recent illness.    The history is provided by the patient, a relative, a caregiver and medical records. No language interpreter was used.  Fall     Past Medical History:  Diagnosis Date  . Complication of anesthesia    lot of confusion  . COPD (chronic obstructive pulmonary disease) ([place])   . Dementia   . Osteoporosis     Patient Active Problem List   Diagnosis Date Noted  . PCP NOTES >>>>>>>>>>. 03/03/2015  . Leg edema, right 10/20/2014  . UTI (urinary tract infection) 07/21/2014  . Dizziness and giddiness 07/21/2014  . Cough 09/22/2013  . Allergic rhinitis 05/01/2013  . Annual physical exam 03/04/2013  . Dementia 10/22/2011  . COPD (chronic obstructive pulmonary disease) ([place]) 03/28/2007  . OSTEOARTHRITIS 03/28/2007  . Osteoporosis 03/28/2007  . URINARY INCONTINENCE 03/28/2007    Past Surgical History:  Procedure Laterality Date  . ANKLE SURGERY     right  . IR GENERIC HISTORICAL  09/05/2016   IR THORACENTESIS ASP PLEURAL SPACE W/IMG GUIDE 09/05/2016 Docia Barrier, PA [facility]  . JOINT REPLACEMENT     right hip, left knee    OB History    No data available       Home Medications    Prior to Admission medications   Medication Sig Start Date End Date Taking? Authorizing Provider  arformoterol (BROVANA) 15 MCG/2ML NEBU Take 2 mLs (15 mcg total) by nebulization 2 (two) times daily. Dx: J44.9 01/10/17  Yes Mannam, Praveen, MD  budesonide (PULMICORT) 0.25 MG/2ML nebulizer solution TAKE 2 MLS (0.25 MG TOTAL) BY NEBULIZATION 2 (TWO) TIMES DAILY. DX:J44.9 04/22/17 04/22/18 Yes Mannam, Praveen, MD  calcium-vitamin D (OSCAL WITH D) 500-200 MG-UNIT per tablet Take 1 tablet by mouth daily.   Yes [provider]  denosumab (PROLIA) 60 MG/ML SOLN injection Inject 60 mg into the skin every 6 (six) months. Administer in upper arm, thigh, or abdomen   Yes [provider]  donepezil (ARICEPT) 10 MG tablet Take 0.5 tablets (5 mg total) by mouth 2 (two) times daily. 05/23/17  Yes Paz, Alda Berthold, MD  Multiple Vitamins-Minerals (MULTIVITAMIN WITH MINERALS) tablet Take 1 tablet by mouth daily.   Yes [provider]  acetaminophen (TYLENOL) 325 MG tablet Take 2 tablets (650 mg total) by mouth every 6 (six) hours as needed for mild pain or moderate pain. 06/07/17   Waynetta Pean, PA-C  albuterol (PROVENTIL) (2.5 MG/3ML) 0.083% nebulizer solution Take 3 mLs (  2.5 mg total) by nebulization every 6 (six) hours as needed for wheezing or shortness of breath. Dx: J44.9 Patient not taking: Reported on 03/23/2017 01/10/17   Marshell Garfinkel, MD  AMBULATORY NON FORMULARY MEDICATION Medication Name: incentive spirometer 01/10/17   Mannam, Hart Robinsons, MD  bacitracin ointment Apply 1 application topically 2 (two) times daily. 06/07/17   Waynetta Pean, PA-C  nitrofurantoin, macrocrystal-monohydrate, (MACROBID) 100 MG capsule Take 1 capsule (100 mg total) by mouth 2 (two) times daily. Patient not taking: Reported on 06/07/2017 05/10/17   Colon Branch, MD  Respiratory Therapy Supplies (FLUTTER) DEVI Use as  directed 01/10/17   Marshell Garfinkel, MD    Family History Family History  Problem Relation Age of Onset  . Brain cancer Father   . Colon cancer Neg Hx   . Breast cancer Neg Hx     Social History Social History   Tobacco Use  . Smoking status: Never Smoker  . Smokeless tobacco: Never Used  Substance Use Topics  . Alcohol use: No  . Drug use: No     Allergies   Patient has no known allergies.   Review of Systems Review of Systems  Unable to perform ROS: Dementia     Physical Exam Updated Vital Signs BP (!) 105/53   Pulse 75   Temp (!) 97.2 F (36.2 C) (Axillary)   Resp 16   SpO2 93%   Physical Exam  Constitutional: She appears well-developed and well-nourished. No distress.  Nontoxic-appearing.  HENT:  Head: Normocephalic.  Right Ear: External ear normal.  Left Ear: External ear normal.  Nose: Nose normal.  Mouth/Throat: Oropharynx is clear and moist.  Superficial abrasion to her left forehead.  Bleeding controlled. Bilateral tympanic membranes are pearly-gray without erythema or loss of landmarks. No hemotympanum.   Eyes: Conjunctivae and EOM are normal. Pupils are equal, round, and reactive to light. Right eye exhibits no discharge. Left eye exhibits no discharge.  Neck: Neck supple.  Wearing c-collar  Cardiovascular: Normal rate, regular rhythm, normal heart sounds and intact distal pulses. Exam reveals no gallop and no friction rub.  No murmur heard. HR 98.   Pulmonary/Chest: Effort normal and breath sounds normal. No respiratory distress. She has no wheezes. She has no rales. She exhibits no tenderness.  Lungs are clear to ascultation bilaterally. Symmetric chest expansion bilaterally. No increased work of breathing. No rales or rhonchi.  No chest wall tenderness.   Abdominal: Soft. She exhibits no mass. There is no tenderness. There is no guarding.  Musculoskeletal: She exhibits tenderness. She exhibits no edema or deformity.  Tenderness to the left  humeral head and humerus with no obvious deformity.  Slight area of ecchymosis noted around the humerus.  No clavicle tenderness bilaterally.  No midline neck or back tenderness.  Tenderness to the lateral aspect of her right hip.  No deformity noted.  Patient's bilateral wrists, elbows, knee and ankle joints are supple and nontender to palpation.  Lymphadenopathy:    She has no cervical adenopathy.  Neurological: She is alert. No sensory deficit. Coordination normal.  Patient is alert and oriented to person only. Dementia.   Skin: Skin is warm and dry. Capillary refill takes less than 2 seconds. No rash noted. She is not diaphoretic. No erythema. No pallor.  Psychiatric: She has a normal mood and affect. Her behavior is normal.  Nursing note and vitals reviewed.    ED Treatments / Results  Labs (all labs ordered are listed, but only abnormal results are  displayed) Labs Reviewed  BASIC METABOLIC PANEL - Abnormal; Notable for the following components:      Result Value   Glucose, Bld 155 (*)    GFR calc non Af Amer 54 (*)    All other components within normal limits  CBC WITH DIFFERENTIAL/PLATELET - Abnormal; Notable for the following components:   WBC 15.0 (*)    Neutro Abs 13.4 (*)    All other components within normal limits    EKG  EKG Interpretation None       Radiology Dg Chest 1 View  Result Date: 06/07/2017 CLINICAL DATA:  Fall today EXAM: CHEST 1 VIEW COMPARISON:  01/08/2017 chest radiograph. FINDINGS: Stable cardiomediastinal silhouette with top-normal heart size and aortic atherosclerosis. No pneumothorax. No right pleural effusion. Trace left pleural effusion, decreased in the interval. No pulmonary edema. No acute consolidative airspace disease. Mild left basilar scarring versus atelectasis. No displaced fractures in the visualized chest. IMPRESSION: 1. Trace left pleural effusion, decreased in the interval. 2. Mild left basilar scarring versus atelectasis.  Electronically Signed   By: Ilona Sorrel M.D.   On: 06/07/2017 17:24   Dg Lumbar Spine Complete  Result Date: 06/07/2017 CLINICAL DATA:  Status post fall today with right hip pain. EXAM: LUMBAR SPINE - COMPLETE 4+ VIEW COMPARISON:  None. FINDINGS: There is no evidence of lumbar spine fracture. There is scoliosis of spine. Degenerative joint changes of the spine are identified. IMPRESSION: No acute fracture noted. Electronically Signed   By: Abelardo Diesel M.D.   On: 06/07/2017 17:25   Ct Head Wo Contrast  Result Date: 06/07/2017 CLINICAL DATA:  Status post fall today at home with laceration to the left forehead. EXAM: CT HEAD WITHOUT CONTRAST CT CERVICAL SPINE WITHOUT CONTRAST TECHNIQUE: Multidetector CT imaging of the head and cervical spine was performed following the standard protocol without intravenous contrast. Multiplanar CT image reconstructions of the cervical spine were also generated. COMPARISON:  Oct 20, 2007 FINDINGS: CT HEAD FINDINGS Brain: No evidence of acute infarction, hemorrhage, hydrocephalus, extra-axial collection or mass lesion/mass effect. Right basilar ganglia calcification is noted. There is chronic diffuse atrophy. Chronic bilateral periventricular white matter small vessel ischemic change is noted. Vascular: No hyperdense vessel or unexpected calcification. Skull: Normal. Negative for fracture or focal lesion. Sinuses/Orbits: There is mucoperiosteal thickening of the right frontal, bilateral ethmoid, left sphenoid, bilateral maxillary sinuses. Other: There is mild extracranial soft tissue swelling of the left superior forehead. CT CERVICAL SPINE FINDINGS Alignment: Normal. Skull base and vertebrae: No acute fracture. No primary bone lesion or focal pathologic process. Soft tissues and spinal canal: No prevertebral fluid or swelling. No visible canal hematoma. Disc levels: There are degenerative joint changes with narrowed joint space and osteophyte formation throughout the  cervical spine. Upper chest: Negative. Other: None. IMPRESSION: No focal acute intracranial abnormality identified. Mild extracranial soft tissue swelling of the left superior forehead. Mucoperiosteal thickening of sinuses as described. No acute fracture or dislocation of cervical spine. Degenerative joint changes of cervical spine. Electronically Signed   By: Abelardo Diesel M.D.   On: 06/07/2017 17:49   Ct Cervical Spine Wo Contrast  Result Date: 06/07/2017 CLINICAL DATA:  Status post fall today at home with laceration to the left forehead. EXAM: CT HEAD WITHOUT CONTRAST CT CERVICAL SPINE WITHOUT CONTRAST TECHNIQUE: Multidetector CT imaging of the head and cervical spine was performed following the standard protocol without intravenous contrast. Multiplanar CT image reconstructions of the cervical spine were also generated. COMPARISON:  Oct 20, 2007  FINDINGS: CT HEAD FINDINGS Brain: No evidence of acute infarction, hemorrhage, hydrocephalus, extra-axial collection or mass lesion/mass effect. Right basilar ganglia calcification is noted. There is chronic diffuse atrophy. Chronic bilateral periventricular white matter small vessel ischemic change is noted. Vascular: No hyperdense vessel or unexpected calcification. Skull: Normal. Negative for fracture or focal lesion. Sinuses/Orbits: There is mucoperiosteal thickening of the right frontal, bilateral ethmoid, left sphenoid, bilateral maxillary sinuses. Other: There is mild extracranial soft tissue swelling of the left superior forehead. CT CERVICAL SPINE FINDINGS Alignment: Normal. Skull base and vertebrae: No acute fracture. No primary bone lesion or focal pathologic process. Soft tissues and spinal canal: No prevertebral fluid or swelling. No visible canal hematoma. Disc levels: There are degenerative joint changes with narrowed joint space and osteophyte formation throughout the cervical spine. Upper chest: Negative. Other: None. IMPRESSION: No focal acute  intracranial abnormality identified. Mild extracranial soft tissue swelling of the left superior forehead. Mucoperiosteal thickening of sinuses as described. No acute fracture or dislocation of cervical spine. Degenerative joint changes of cervical spine. Electronically Signed   By: Abelardo Diesel M.D.   On: 06/07/2017 17:49   Dg Shoulder Left  Result Date: 06/07/2017 CLINICAL DATA:  Fall.  Left arm pain. EXAM: LEFT SHOULDER - 2+ VIEW COMPARISON:  None. FINDINGS: Single frontal view of the left shoulder shows any comminuted proximal humerus fracture involving the metaphysis and proximal diaphysis. Although difficult to assess on this single projection, there is probably extension of the fracture line into the humeral head and the greater tuberosity may exist as a free fragment. IMPRESSION: Complex comminuted proximal humerus fracture. Electronically Signed   By: Misty Stanley M.D.   On: 06/07/2017 17:25   Dg Humerus Left  Result Date: 06/07/2017 CLINICAL DATA:  Status post fall today with left arm pain. EXAM: LEFT HUMERUS - 2+ VIEW COMPARISON:  None. FINDINGS: There is comminuted displaced fracture of the left humeral head and the left proximal to mid humeral shaft. IMPRESSION: Fracture of the left humerus as described. Electronically Signed   By: Abelardo Diesel M.D.   On: 06/07/2017 17:24   Dg Hip Unilat With Pelvis 2-3 Views Right  Result Date: 06/07/2017 CLINICAL DATA:  Fall. EXAM: DG HIP (WITH OR WITHOUT PELVIS) 2-3V RIGHT COMPARISON:  03/23/2017. FINDINGS: Total right hip replacement. Hardware intact. Anatomic alignment. Callus formation noted about the superior inferior pubic rami consistent with healed old fractures. Diffuse osteopenia. Degenerative changes and scoliosis lumbar spine. IMPRESSION: 1. Prior total right hip replacement. Hardware intact. Anatomic alignment. Stable in appearance from 03/23/2017. 2. Old right superior inferior pubic rami fractures again noted. 3. Diffuse osteopenia.  Degenerative changes lumbar spine with scoliosis concave left. Electronically Signed   By: Marcello Moores  Register   On: 06/07/2017 17:27    Procedures Procedures (including critical care time)  Medications Ordered in ED Medications  fentaNYL (SUBLIMAZE) injection 25 mcg (25 mcg Intravenous Given 06/07/17 1546)  fentaNYL (SUBLIMAZE) injection 25 mcg (25 mcg Intravenous Given 06/07/17 1807)  Tdap (BOOSTRIX) injection 0.5 mL (0.5 mLs Intramuscular Given 06/07/17 1949)  acetaminophen (TYLENOL) tablet 650 mg (650 mg Oral Given 06/07/17 1949)     Initial Impression / Assessment and Plan / ED Course  I have reviewed the triage vital signs and the nursing notes.  Pertinent labs & imaging results that were available during my care of the patient were reviewed by me and considered in my medical decision making (see chart for details).    This  is a 83 y.o. Female  who presents to the ED from home after a trip and fall.  Patient lives at home with her son.  He reports he was not watching her for a second and she stood up on her own and started walking and tripped in the hallway falling onto the ground.  No loss of consciousness.  He reports she was lying on her back after the fall.  She is complaining of left arm pain and right hip pain.  She is not on anticoagulants.  Son reports that she has been acting at her baseline recently.  She has dementia and is typically confused.  She has had no fevers or recent illness.  On exam the patient is afebrile nontoxic-appearing.  She has a superficial abrasion to her left forehead.  Bleeding is controlled.  No other visible or palpated signs of head injury or trauma.  She is tenderness to her right lateral hip.  Son reports this is likely chronic.  She is good range of motion at her hip without difficulty.  Will obtain x-ray.  She also has tenderness and ecchymosis to her left humerus. CT head and neck without contrast showed no acute findings. Chest x-ray is  unremarkable. Right hip with pelvis shows no acute fracture. Left shoulder and humerus x-rays show a comminuted displaced fracture of the left humeral head and the left proximal to mid humeral shaft.  I consulted with orthopedic surgeon Dr. Stann Mainland who recommends shoulder sling and will see the patient in follow-up in about 1-2 weeks.  I discussed the plan with the patient's family.  They agree.  We discussed precautions with using shoulder sling. We discussed return precautions.  Wound care instructions provided.  Discharge with Tylenol and bacitracin. I advised the patient to follow-up with their primary care provider this week. I advised the patient to return to the emergency department with new or worsening symptoms or new concerns. The patient's family verbalized understanding and agreement with plan.    This patient was discussed with and evaluated by Dr. Ellender Hose who agrees with assessment and plan.   Final Clinical Impressions(s) / ED Diagnoses   Final diagnoses:  Minor head injury, initial encounter  Fall, initial encounter  Abrasion of forehead, initial encounter  Closed displaced spiral fracture of shaft of left humerus, initial encounter  Closed fracture of head of left humerus, initial encounter  Right hip pain  Need for diphtheria-tetanus-pertussis (Tdap) vaccine    ED Discharge Orders        Ordered    acetaminophen (TYLENOL) 325 MG tablet  Every 6 hours PRN     06/07/17 1936    bacitracin ointment  2 times daily     06/07/17 1936       Waynetta Pean, Hershal Coria 06/08/17 0142    Duffy Bruce, MD 06/08/17 1151

## 2017-06-07 NOTE — ED Triage Notes (Signed)
Patient presents to ed via ems states patient was walking down the hall at her home and fell landing on her left side. . deformity to left upper arm. Laceration to left side of upper forehead. Unknown loc. Patient has dementia and is at her baseline per family . Patient is non-verbal.

## 2017-06-07 NOTE — ED Notes (Signed)
Patient transported to X-ray 

## 2017-06-07 NOTE — Progress Notes (Signed)
Orthopedic Tech Progress Note Patient Details:  Donna Flowers 25-Jul-1927 [id number]  Ortho Devices Type of Ortho Device: Shoulder immobilizer Ortho Device/Splint Location: LUE Ortho Device/Splint Interventions: Ordered, Application   Post Interventions Patient Tolerated: Well Instructions Provided: Care of device   Braulio Bosch 06/07/2017, 7:19 PM

## 2017-06-10 ENCOUNTER — Encounter: Payer: Self-pay | Admitting: Gastroenterology

## 2017-07-02 DIAGNOSIS — S42292D Other displaced fracture of upper end of left humerus, subsequent encounter for fracture with routine healing: Secondary | ICD-10-CM | POA: Diagnosis not present

## 2017-07-03 ENCOUNTER — Ambulatory Visit: Payer: Medicare Other | Admitting: Internal Medicine

## 2017-07-03 ENCOUNTER — Other Ambulatory Visit: Payer: Self-pay

## 2017-07-03 ENCOUNTER — Emergency Department (HOSPITAL_BASED_OUTPATIENT_CLINIC_OR_DEPARTMENT_OTHER)
Admission: EM | Admit: 2017-07-03 | Discharge: 2017-07-03 | Disposition: A | Discharge status: Home / Self Care | Payer: Medicare Other | Attending: Physician Assistant | Admitting: Physician Assistant

## 2017-07-03 ENCOUNTER — Encounter (HOSPITAL_BASED_OUTPATIENT_CLINIC_OR_DEPARTMENT_OTHER): Payer: Self-pay | Admitting: *Deleted

## 2017-07-03 DIAGNOSIS — N3090 Cystitis, unspecified without hematuria: Secondary | ICD-10-CM | POA: Insufficient documentation

## 2017-07-03 DIAGNOSIS — Z96652 Presence of left artificial knee joint: Secondary | ICD-10-CM | POA: Insufficient documentation

## 2017-07-03 DIAGNOSIS — F0390 Unspecified dementia without behavioral disturbance: Secondary | ICD-10-CM | POA: Insufficient documentation

## 2017-07-03 DIAGNOSIS — J449 Chronic obstructive pulmonary disease, unspecified: Secondary | ICD-10-CM | POA: Insufficient documentation

## 2017-07-03 DIAGNOSIS — Z96641 Presence of right artificial hip joint: Secondary | ICD-10-CM | POA: Insufficient documentation

## 2017-07-03 DIAGNOSIS — Z79899 Other long term (current) drug therapy: Secondary | ICD-10-CM | POA: Insufficient documentation

## 2017-07-03 DIAGNOSIS — R350 Frequency of micturition: Secondary | ICD-10-CM | POA: Insufficient documentation

## 2017-07-03 DIAGNOSIS — N3091 Cystitis, unspecified with hematuria: Secondary | ICD-10-CM | POA: Diagnosis not present

## 2017-07-03 DIAGNOSIS — R319 Hematuria, unspecified: Secondary | ICD-10-CM | POA: Diagnosis present

## 2017-07-03 LAB — URINALYSIS, ROUTINE W REFLEX MICROSCOPIC
Bilirubin Urine: NEGATIVE
GLUCOSE, UA: NEGATIVE mg/dL
Ketones, ur: NEGATIVE mg/dL
Nitrite: NEGATIVE
PH: 6 (ref 5.0–8.0)
Protein, ur: >300 mg/dL — AB
Specific Gravity, Urine: 1.025 (ref 1.005–1.030)

## 2017-07-03 LAB — URINALYSIS, MICROSCOPIC (REFLEX)

## 2017-07-03 MED ORDER — CEPHALEXIN 500 MG PO CAPS: 500.0000 mg | ORAL_CAPSULE | Freq: Two times a day (BID) | ORAL | 0 refills | Status: DC

## 2017-07-03 MED FILL — CEPHALEXIN 500 MG CAPSULE: 500 | 10 days supply | Qty: 20 | Fill #0

## 2017-07-03 NOTE — ED Notes (Signed)
ED Provider at bedside. 

## 2017-07-03 NOTE — ED Triage Notes (Signed)
Sitter states blood in urine x 1 week

## 2017-07-03 NOTE — ED Notes (Signed)
Pt's family verbalizes understanding of dc instructions and denies any further needs at this time

## 2017-07-03 NOTE — ED Provider Notes (Signed)
[facility] EMERGENCY DEPARTMENT Provider Note   CSN: [id number] Arrival date & time: 07/03/17  1503     History   Chief Complaint Chief Complaint  Patient presents with  . Hematuria    HPI Donna Flowers is a 84 y.o. female.  HPI   Donna Flowers is a 84 y.o. female, with a history of COPD and dementia, presenting to the ED with blood in the patient's brief.  Patient's aide states she has noted two instances in the last week where there was blood in the patient's brief. She also notes increased urination frequency. Denies vaginal bleeding, dysuria, hematochezia, abdominal pain, fever, N/V/D, or any other complaints.     Past Medical History:  Diagnosis Date  . Complication of anesthesia    lot of confusion  . COPD (chronic obstructive pulmonary disease) ([place])   . Dementia   . Osteoporosis     Patient Active Problem List   Diagnosis Date Noted  . PCP NOTES >>>>>>>>>>. 03/03/2015  . Leg edema, right 10/20/2014  . UTI (urinary tract infection) 07/21/2014  . Dizziness and giddiness 07/21/2014  . Cough 09/22/2013  . Allergic rhinitis 05/01/2013  . Annual physical exam 03/04/2013  . Dementia 10/22/2011  . COPD (chronic obstructive pulmonary disease) ([place]) 03/28/2007  . OSTEOARTHRITIS 03/28/2007  . Osteoporosis 03/28/2007  . URINARY INCONTINENCE 03/28/2007    Past Surgical History:  Procedure Laterality Date  . ANKLE SURGERY     right  . IR GENERIC HISTORICAL  09/05/2016   IR THORACENTESIS ASP PLEURAL SPACE W/IMG GUIDE 09/05/2016 Docia Barrier, PA [facility]  . JOINT REPLACEMENT     right hip, left knee    OB History    No data available       Home Medications    Prior to Admission medications   Medication Sig Start Date End Date Taking? Authorizing Provider  acetaminophen (TYLENOL) 325 MG tablet Take 2 tablets (650 mg total) by mouth every 6 (six) hours as needed for mild pain or moderate pain. 06/07/17   Waynetta Pean, PA-C   albuterol (PROVENTIL) (2.5 MG/3ML) 0.083% nebulizer solution Take 3 mLs (2.5 mg total) by nebulization every 6 (six) hours as needed for wheezing or shortness of breath. Dx: J44.9 Patient not taking: Reported on 03/23/2017 01/10/17   Marshell Garfinkel, MD  AMBULATORY NON FORMULARY MEDICATION Medication Name: incentive spirometer 01/10/17   Mannam, Hart Robinsons, MD  arformoterol (BROVANA) 15 MCG/2ML NEBU Take 2 mLs (15 mcg total) by nebulization 2 (two) times daily. Dx: J44.9 01/10/17   Marshell Garfinkel, MD  bacitracin ointment Apply 1 application topically 2 (two) times daily. 06/07/17   Waynetta Pean, PA-C  budesonide (PULMICORT) 0.25 MG/2ML nebulizer solution TAKE 2 MLS (0.25 MG TOTAL) BY NEBULIZATION 2 (TWO) TIMES DAILY. DX:J44.9 04/22/17 04/22/18  Mannam, Hart Robinsons, MD  calcium-vitamin D (OSCAL WITH D) 500-200 MG-UNIT per tablet Take 1 tablet by mouth daily.    [provider]  cephALEXin (KEFLEX) 500 MG capsule Take 1 capsule (500 mg total) by mouth 2 (two) times daily. 07/03/17   Joy, Shawn C, PA-C  denosumab (PROLIA) 60 MG/ML SOLN injection Inject 60 mg into the skin every 6 (six) months. Administer in upper arm, thigh, or abdomen    [provider]  donepezil (ARICEPT) 10 MG tablet Take 0.5 tablets (5 mg total) by mouth 2 (two) times daily. 05/23/17   Colon Branch, MD  Multiple Vitamins-Minerals (MULTIVITAMIN WITH MINERALS) tablet Take 1 tablet by mouth daily.  [provider]  nitrofurantoin, macrocrystal-monohydrate, (MACROBID) 100 MG capsule Take 1 capsule (100 mg total) by mouth 2 (two) times daily. Patient not taking: Reported on 06/07/2017 05/10/17   Colon Branch, MD  Respiratory Therapy Supplies (FLUTTER) DEVI Use as directed 01/10/17   Marshell Garfinkel, MD    Family History Family History  Problem Relation Age of Onset  . Brain cancer Father   . Colon cancer Neg Hx   . Breast cancer Neg Hx     Social History Social History   Tobacco Use  . Smoking status: Never  Smoker  . Smokeless tobacco: Never Used  Substance Use Topics  . Alcohol use: No  . Drug use: No     Allergies   Patient has no known allergies.   Review of Systems Review of Systems  Constitutional: Negative for chills, diaphoresis and fever.  Gastrointestinal: Negative for abdominal pain, blood in stool, diarrhea, nausea and vomiting.  Genitourinary: Positive for hematuria. Negative for dysuria.  All other systems reviewed and are negative.    Physical Exam Updated Vital Signs BP 135/67   Pulse 79   Temp 98.1 F (36.7 C)   Resp 16   Ht 5\' 4"  (1.626 m)   Wt 54.4 kg (120 lb)   SpO2 96%   BMI 20.60 kg/m   Physical Exam  Constitutional: She appears well-developed and well-nourished. No distress.  HENT:  Head: Normocephalic and atraumatic.  Eyes: Conjunctivae are normal.  Neck: Neck supple.  Cardiovascular: Normal rate, regular rhythm, normal heart sounds and intact distal pulses.  Pulmonary/Chest: Effort normal and breath sounds normal. No respiratory distress.  Abdominal: Soft. There is no tenderness. There is no guarding.  Genitourinary:  Genitourinary Comments: No noted vaginal or rectal bleeding..  No noted GU/GYN trauma.  RN served as Producer, television/film/video during exam.  Musculoskeletal: She exhibits no edema.  Lymphadenopathy:    She has no cervical adenopathy.  Neurological: She is alert.  Skin: Skin is warm and dry. She is not diaphoretic.  Psychiatric: She has a normal mood and affect. Her behavior is normal.  Nursing note and vitals reviewed.    ED Treatments / Results  Labs (all labs ordered are listed, but only abnormal results are displayed) Labs Reviewed  URINALYSIS, ROUTINE W REFLEX MICROSCOPIC - Abnormal; Notable for the following components:      Result Value   Color, Urine BROWN (*)    APPearance CLOUDY (*)    Hgb urine dipstick LARGE (*)    Protein, ur >300 (*)    Leukocytes, UA MODERATE (*)    All other components within normal limits    URINALYSIS, MICROSCOPIC (REFLEX) - Abnormal; Notable for the following components:   Bacteria, UA MANY (*)    Squamous Epithelial / LPF 0-5 (*)    All other components within normal limits  URINE CULTURE    EKG  EKG Interpretation None       Radiology No results found.  Procedures Procedures (including critical care time)  Medications Ordered in ED Medications - No data to display   Initial Impression / Assessment and Plan / ED Course  I have reviewed the triage vital signs and the nursing notes.  Pertinent labs & imaging results that were available during my care of the patient were reviewed by me and considered in my medical decision making (see chart for details).     Patient presents with hematuria. Patient is nontoxic appearing, afebrile, not tachycardic, not tachypneic, not hypotensive and is in  no apparent distress.  Evidence of UTI on UA. Patient and her caregiver were given instructions for home care as well as return precautions. Both parties voice understanding of these instructions, accept the plan, and are comfortable with discharge.  Findings and plan of care discussed with Zenovia Jarred, MD.    Final Clinical Impressions(s) / ED Diagnoses   Final diagnoses:  Cystitis    ED Discharge Orders        Ordered    cephALEXin (KEFLEX) 500 MG capsule  2 times daily     07/03/17 1712       Lorayne Bender, PA-C 07/04/17 0145    Macarthur Critchley, MD 07/04/17 1622

## 2017-07-03 NOTE — Discharge Instructions (Signed)
There is evidence of an UTI on the urinalysis.  Please take all of your antibiotics until finished!   You may develop abdominal discomfort or diarrhea from the antibiotic.  You may help offset this with probiotics which you can buy or get in yogurt. Do not eat or take the probiotics until 2 hours after your antibiotic.  Follow-up with your primary care provider for any further management of this issue. Return to the ED for persistent fever, increased confusion, abdominal pain, worsening symptoms, or any other major concerns.

## 2017-07-04 LAB — URINE CULTURE: CULTURE: NO GROWTH

## 2017-07-09 ENCOUNTER — Ambulatory Visit: Payer: Medicare Other | Admitting: Pulmonary Disease

## 2017-07-12 ENCOUNTER — Telehealth: Payer: Self-pay | Admitting: Internal Medicine

## 2017-07-12 NOTE — Telephone Encounter (Signed)
Spoke w/ Manus Gunning, [facility] referred Pt to palliative care. Verbal given to Manus Gunning for PCP to be attending.

## 2017-07-12 NOTE — Telephone Encounter (Signed)
Copied from [place] [phone]. Topic: Inquiry >> Jul 12, 2017  4:10 PM Corie Chiquito, [place] wrote: Reason for CRM: Manus Gunning called because she would like to know if Dr.Paz could give a verbal authorization for the patient to be placed on palliative care. If someone could give her a call back about this at [phone]

## 2017-07-23 ENCOUNTER — Ambulatory Visit (INDEPENDENT_AMBULATORY_CARE_PROVIDER_SITE_OTHER): Payer: Medicare Other | Admitting: Pulmonary Disease

## 2017-07-23 ENCOUNTER — Encounter: Payer: Self-pay | Admitting: Pulmonary Disease

## 2017-07-23 ENCOUNTER — Ambulatory Visit (INDEPENDENT_AMBULATORY_CARE_PROVIDER_SITE_OTHER)
Admission: RE | Admit: 2017-07-23 | Discharge: 2017-07-23 | Disposition: A | Discharge status: Home / Self Care | Payer: Medicare Other | Source: Ambulatory Visit | Attending: Pulmonary Disease | Admitting: Pulmonary Disease

## 2017-07-23 VITALS — BP 124/66 | HR 85 | Ht 64.0 in | Wt 134.0 lb

## 2017-07-23 DIAGNOSIS — J9811 Atelectasis: Secondary | ICD-10-CM

## 2017-07-23 DIAGNOSIS — J90 Pleural effusion, not elsewhere classified: Secondary | ICD-10-CM

## 2017-07-23 MED ORDER — ARFORMOTEROL TARTRATE 15 MCG/2ML IN NEBU: 15.0000 ug | INHALATION_SOLUTION | Freq: Two times a day (BID) | RESPIRATORY_TRACT | 2 refills | Status: AC

## 2017-07-23 MED ORDER — BUDESONIDE 0.25 MG/2ML IN SUSP: 0.2500 mg | Freq: Two times a day (BID) | RESPIRATORY_TRACT | 2 refills | Status: AC

## 2017-07-23 NOTE — Patient Instructions (Signed)
Chest x-ray shows that the pleural effusion and lung collapse on the left slightly more than last time. I still recommend that we do conservative management and keep a watch on it I will follow-up with chest x-ray in 6 months and an office visit Please call us sooner if there is any change in her respiratory status.

## 2017-07-23 NOTE — Progress Notes (Signed)
Donna Flowers    [id number]    [birth year]  Primary Care Physician:Paz, Alda Berthold, MD  Referring Physician: Colon Branch, MD [address] [place], [place] [address]  Chief complaint:   Follow up for COPD,  Pleural effusion Persistent LLL collapse  HPI: 84 year old with history of rheumatoid arthritis, dementia, COPD, osteoarthritis. She had several episodes of bronchitis over the winter of 2017 which were treated with antibiotics. She was found to have a large left pleural effusion upon evaluation for dyspnea and chest pain. She underwent a thoracentesis by IR on March 2018 with removal of 1 L of exudative fluid. Cell count was significant for elevated lymphocytes and eosinophils. There is no growth on cultures or malignancy on cytology.   She is a lifelong nonsmoker but had significant exposure to secondhand smoke. She had PFTs in 2013 that showed obstruction. She has been prescribed Symbicort but is not taking it on a regular basis. Chief complaint is dyspnea with activity and rest, nonproductive cough with occasional wheezing. She has significant history of rheumatoid arthritis and was on treatment several years ago. His son is unable to recall what it was. She is currently not on treatment for rheumatoid arthritis.  Pets: None Occupation: Retired Freight forwarder for a [employer] Exposures: No known exposures. No mold issues at home Smoking history: Nonsmoker but has exposure to secondhand smoke  Interim History: Started on nebulizers at last visit. Continues on Brovana and Pulmicort with improvement in symptoms of cough, congestion.  She has albuterol but does not need to use it on a regular basis. She had a fall with left humerus fracture which is being treated conservatively.   Outpatient Encounter Medications as of 07/23/2017  Medication Sig  . acetaminophen (TYLENOL) 325 MG tablet Take 2 tablets (650 mg total) by mouth every 6 (six) hours as needed for mild  pain or moderate pain.  Marland Kitchen albuterol (PROVENTIL) (2.5 MG/3ML) 0.083% nebulizer solution Take 3 mLs (2.5 mg total) by nebulization every 6 (six) hours as needed for wheezing or shortness of breath. Dx: J44.9  . AMBULATORY NON FORMULARY MEDICATION Medication Name: incentive spirometer  . arformoterol (BROVANA) 15 MCG/2ML NEBU Take 2 mLs (15 mcg total) by nebulization 2 (two) times daily. Dx: J44.9  . budesonide (PULMICORT) 0.25 MG/2ML nebulizer solution TAKE 2 MLS (0.25 MG TOTAL) BY NEBULIZATION 2 (TWO) TIMES DAILY. DX:J44.9  . calcium-vitamin D (OSCAL WITH D) 500-200 MG-UNIT per tablet Take 1 tablet by mouth daily.  Marland Kitchen denosumab (PROLIA) 60 MG/ML SOLN injection Inject 60 mg into the skin every 6 (six) months. Administer in upper arm, thigh, or abdomen  . donepezil (ARICEPT) 10 MG tablet Take 0.5 tablets (5 mg total) by mouth 2 (two) times daily.  . Multiple Vitamins-Minerals (MULTIVITAMIN WITH MINERALS) tablet Take 1 tablet by mouth daily.  . [DISCONTINUED] cephALEXin (KEFLEX) 500 MG capsule Take 1 capsule (500 mg total) by mouth 2 (two) times daily.  . [DISCONTINUED] nitrofurantoin, macrocrystal-monohydrate, (MACROBID) 100 MG capsule Take 1 capsule (100 mg total) by mouth 2 (two) times daily.  . bacitracin ointment Apply 1 application topically 2 (two) times daily. (Patient not taking: Reported on 07/23/2017)  . Respiratory Therapy Supplies (FLUTTER) DEVI Use as directed (Patient not taking: Reported on 07/23/2017)   No facility-administered encounter medications on file as of 07/23/2017.     Allergies as of 07/23/2017  . (No Known Allergies)    Past Medical History:  Diagnosis Date  .  Complication of anesthesia    lot of confusion  . COPD (chronic obstructive pulmonary disease) ([place])   . Dementia   . Osteoporosis     Past Surgical History:  Procedure Laterality Date  . ANKLE SURGERY     right  . IR GENERIC HISTORICAL  09/05/2016   IR THORACENTESIS ASP PLEURAL SPACE W/IMG GUIDE 09/05/2016  Docia Barrier, PA [facility]  . JOINT REPLACEMENT     right hip, left knee    Family History  Problem Relation Age of Onset  . Brain cancer Father   . Colon cancer Neg Hx   . Breast cancer Neg Hx     Social History   Socioeconomic History  . Marital status: Widowed    Spouse name: Not on file  . Number of children: 2  . Years of education: Not on file  . Highest education level: Not on file  Social Needs  . Financial resource strain: Not on file  . Food insecurity - worry: Not on file  . Food insecurity - inability: Not on file  . Transportation needs - medical: Not on file  . Transportation needs - non-medical: Not on file  Occupational History  . Occupation: elderly  Tobacco Use  . Smoking status: Never Smoker  . Smokeless tobacco: Never Used  Substance and Sexual Activity  . Alcohol use: No  . Drug use: No  . Sexual activity: Not on file  Other Topics Concern  . Not on file  Social History Narrative   Lives by herself.   Since admission 09-2013, has a sitter M-F  8-5 pm. Son visit after 5 pm till she goes to sleep   When awake somebody is with her 85% of the time    [name] (son) [phone]   [name] (lives in [place])          Review of systems: Review of Systems  Constitutional: Negative for fever and chills.  HENT: Negative.   Eyes: Negative for blurred vision.  Respiratory: as per HPI  Cardiovascular: Negative for chest pain and palpitations.  Gastrointestinal: Negative for vomiting, diarrhea, blood per rectum. Genitourinary: Negative for dysuria, urgency, frequency and hematuria.  Musculoskeletal: Negative for myalgias, back pain and joint pain.  Skin: Negative for itching and rash.  Neurological: Negative for dizziness, tremors, focal weakness, seizures and loss of consciousness.  Endo/Heme/Allergies: Negative for environmental allergies.  Psychiatric/Behavioral: Negative for depression, suicidal ideas and hallucinations.  All other  systems reviewed and are negative.  Physical Exam: Blood pressure 124/66, pulse 85, height 5\' 4"  (1.626 m), weight 134 lb (60.8 kg), SpO2 94 %. Gen:      No acute distress HEENT:  EOMI, sclera anicteric Neck:     No masses; no thyromegaly Lungs:    Clear to auscultation bilaterally; normal respiratory effort CV:         Regular rate and rhythm; no murmurs Abd:      + bowel sounds; soft, non-tender; no palpable masses, no distension Ext:    No edema; adequate peripheral perfusion Skin:      Warm and dry; no rash Neuro: alert and oriented x 3 Psych: normal mood and affect  Data Reviewed: Pleural studies 09/05/16 LDH 123 , protein 4 WBC 940, 57% lymphs, 25% eos, 18% monocyte macrophage Culture- negative Cytology-lymphocytes and eosinophils, reactive mesothelial cells. No malignant cells identified.  CT abdomen pelvis 05/01/15-bibasilar atelectasis on lung images Chest x-ray 08/27/16-large left pleural effusion Chest x-ray 09/05/16-slight decrease in left effusion after  thoracentesis CT chest 11/17/14- left lower lobe collapse, trace pleural effusion. 2 mm left upper lobe pulmonary nodule. Chest x-ray 01/08/17- hyperinflation, small left effusion. Persistent left lower lobe opacity/collapse. Chest x-ray 07/23/17-increase in left lower lobe collapse, pleural effusion, left humerus fracture I had reviewed all images personally.  PFTs 10/30/11 FVC 2.83 [106%], FEV1 1.72 (94%), F/F 61, TLC 105%, DLCO 101% Moderate obstruction with no bronchodilator response.  Immunology 11/08/16 CCP < 16, RA 18, ANA- negative  Assessment:  Left pleural effusion Left lower lobe collapse The effusion was exudative with lymphocytes and eosinophil predominance. This could have been parapneumonic but she does not have any evidence of active infection at present and she does not require additional antibiotics. This does not appear to be reactivation of RA as her autoimmune labs are unremarkable. There is no obvious  endobronchial lesion. Given her frailty, dementia we have decided on conservative management at last visit. I believe a bronchoscopy is high risk for her. As per her son she had a hard time tolerating the thoracentesis itself. We would focus on symptom management. There is a possibility that she may have a malignancy in that area but even then they would not want to be aggressive with treatment for that.  Chest x-ray today noted with small increase in the left atelectasis and effusion.  As noted above we will continue conservative management.  Repeat chest x-ray in 6 months.  If her effusion accumulates rapidly causing respiratory distress then we may consider repeat thoracentesis for symptomatic relief.  Discussed again with son today.  COPD Has COPD even though she is a nonsmoker. Symptoms are stable on nebulizers.  She cannot use inhaler due to issues with dexterity Consider alpha-1 antitrypsin testing at next visit.    Plan/Recommendations: - Flutter valve, incentive spirometer.  - Nebulizer with brovana, pulmicort, albuterol PRN.  - Follow up in 6 months with repeat x-ray  Marshell Garfinkel MD [facility] Pager [phone] 07/23/2017, 11:56 AM  CC: Colon Branch, MD

## 2017-08-07 DIAGNOSIS — S42392D Other fracture of shaft of left humerus, subsequent encounter for fracture with routine healing: Secondary | ICD-10-CM | POA: Diagnosis not present

## 2017-08-13 ENCOUNTER — Telehealth: Payer: Self-pay

## 2017-08-13 NOTE — Telephone Encounter (Signed)
Received Palliative Care orders from [facility]- forms signed and mailed back in envelope provided. Copies of forms sent for scanning.

## 2017-09-02 ENCOUNTER — Telehealth: Payer: Self-pay

## 2017-09-02 NOTE — Telephone Encounter (Signed)
Orders signed and mailed back to Hospice in envelope provided. Copy of signed form sent for scanning.

## 2017-09-25 ENCOUNTER — Telehealth: Payer: Self-pay | Admitting: *Deleted

## 2017-09-25 NOTE — Telephone Encounter (Signed)
Received Physician Orders from [facility], program of Hospice via mail; forwarded to provider/SLS 04/17

## 2017-09-27 ENCOUNTER — Telehealth: Payer: Self-pay | Admitting: Internal Medicine

## 2017-09-27 NOTE — Telephone Encounter (Signed)
Copied from [place] [phone]. Topic: Quick Communication - See Telephone Encounter >> Sep 27, 2017  3:15 PM Vernona Rieger wrote: CRM for notification. See Telephone encounter for: 09/27/17.  Tammy from [facility] called and said that they have her in the care [facility] program & she is having some declining. She will be faxing over the blood work to Dr Larose Kells. When they go out on Monday, if she is still declining they would like to put her in the hospice program and/if Dr Larose Kells will be the attending physician for her. If not, would you want there doctors to be the attendings. Please advise   Call back is [phone]

## 2017-09-30 DIAGNOSIS — F0390 Unspecified dementia without behavioral disturbance: Secondary | ICD-10-CM | POA: Diagnosis not present

## 2017-09-30 DIAGNOSIS — Z87440 Personal history of urinary (tract) infections: Secondary | ICD-10-CM | POA: Diagnosis not present

## 2017-09-30 DIAGNOSIS — R319 Hematuria, unspecified: Secondary | ICD-10-CM | POA: Diagnosis not present

## 2017-09-30 DIAGNOSIS — Z515 Encounter for palliative care: Secondary | ICD-10-CM | POA: Diagnosis not present

## 2017-09-30 DIAGNOSIS — J449 Chronic obstructive pulmonary disease, unspecified: Secondary | ICD-10-CM | POA: Diagnosis not present

## 2017-09-30 NOTE — Telephone Encounter (Signed)
LMOM for Donna Flowers w/ fax number and informing that PCP will let hospice MDs there be attending. Instructed her to call if questions/concerns.

## 2017-09-30 NOTE — Telephone Encounter (Signed)
PCP unable to sign forms- he has not seen Pt in last 3 months. Form mailed back to Hospice in envelope provided. Copy of form sent for scanning.

## 2017-09-30 NOTE — Telephone Encounter (Signed)
Please advise 

## 2017-09-30 NOTE — Telephone Encounter (Signed)
-  ok to send me the labs - haven't seen them in > 4 months, will let the hospice MD be the attending

## 2017-10-01 DIAGNOSIS — R32 Unspecified urinary incontinence: Secondary | ICD-10-CM | POA: Diagnosis not present

## 2017-10-04 DIAGNOSIS — J449 Chronic obstructive pulmonary disease, unspecified: Secondary | ICD-10-CM | POA: Diagnosis not present

## 2017-10-04 DIAGNOSIS — G301 Alzheimer's disease with late onset: Secondary | ICD-10-CM | POA: Diagnosis not present

## 2017-10-04 DIAGNOSIS — Z7722 Contact with and (suspected) exposure to environmental tobacco smoke (acute) (chronic): Secondary | ICD-10-CM | POA: Diagnosis not present

## 2017-10-04 DIAGNOSIS — M810 Age-related osteoporosis without current pathological fracture: Secondary | ICD-10-CM | POA: Diagnosis not present

## 2017-10-04 DIAGNOSIS — F0280 Dementia in other diseases classified elsewhere without behavioral disturbance: Secondary | ICD-10-CM | POA: Diagnosis not present

## 2017-10-04 DIAGNOSIS — M069 Rheumatoid arthritis, unspecified: Secondary | ICD-10-CM | POA: Diagnosis not present

## 2017-10-07 DIAGNOSIS — F0280 Dementia in other diseases classified elsewhere without behavioral disturbance: Secondary | ICD-10-CM | POA: Diagnosis not present

## 2017-10-07 DIAGNOSIS — M069 Rheumatoid arthritis, unspecified: Secondary | ICD-10-CM | POA: Diagnosis not present

## 2017-10-07 DIAGNOSIS — M810 Age-related osteoporosis without current pathological fracture: Secondary | ICD-10-CM | POA: Diagnosis not present

## 2017-10-07 DIAGNOSIS — G301 Alzheimer's disease with late onset: Secondary | ICD-10-CM | POA: Diagnosis not present

## 2017-10-07 DIAGNOSIS — J449 Chronic obstructive pulmonary disease, unspecified: Secondary | ICD-10-CM | POA: Diagnosis not present

## 2017-10-07 DIAGNOSIS — Z7722 Contact with and (suspected) exposure to environmental tobacco smoke (acute) (chronic): Secondary | ICD-10-CM | POA: Diagnosis not present

## 2017-10-08 DIAGNOSIS — F0280 Dementia in other diseases classified elsewhere without behavioral disturbance: Secondary | ICD-10-CM | POA: Diagnosis not present

## 2017-10-08 DIAGNOSIS — M810 Age-related osteoporosis without current pathological fracture: Secondary | ICD-10-CM | POA: Diagnosis not present

## 2017-10-08 DIAGNOSIS — Z7722 Contact with and (suspected) exposure to environmental tobacco smoke (acute) (chronic): Secondary | ICD-10-CM | POA: Diagnosis not present

## 2017-10-08 DIAGNOSIS — M069 Rheumatoid arthritis, unspecified: Secondary | ICD-10-CM | POA: Diagnosis not present

## 2017-10-08 DIAGNOSIS — G301 Alzheimer's disease with late onset: Secondary | ICD-10-CM | POA: Diagnosis not present

## 2017-10-08 DIAGNOSIS — J449 Chronic obstructive pulmonary disease, unspecified: Secondary | ICD-10-CM | POA: Diagnosis not present

## 2017-10-09 DEATH — deceased

## 2017-10-25 ENCOUNTER — Telehealth: Payer: Self-pay | Admitting: Internal Medicine

## 2017-10-25 NOTE — Telephone Encounter (Signed)
Copied from [place] [phone]. Topic: Quick Communication - Office Called Patient >> Oct 25, 2017 11:12 AM Rosalin Hawking wrote: Reason for CRM:

## 2017-10-25 NOTE — Telephone Encounter (Signed)
Copied from [place] [id number]. Topic: Quick Communication - Office Called Patient >> Oct 25, 2017 11:14 AM Donna Flowers wrote: Reason for CRM: Called pt to remind about appt for Monday 10-28-2017, spoke with Son Donna Flowers and stated that mother (pt) has passed away since 10-09-17. He wanted to inform provider about this but did not had the time too. Wants provider to know.

## 2017-10-25 NOTE — Telephone Encounter (Addendum)
FYI

## 2017-10-28 ENCOUNTER — Ambulatory Visit: Payer: Medicare Other | Admitting: Internal Medicine

## 2017-10-28 NOTE — Telephone Encounter (Signed)
I spoke with [name], provided my condolences; he did a extraordinary job taking care of his mother. He was very appreciative of the care Ms [place]land  got at this office from the front desk staff, nurses, MDs etc.  I will let them know.

## 2019-08-30 IMAGING — CT CT HIP*R* W/O CM
4 of 7 series · 10 of 46 positions shown, 17 images · non-contrast
Comparison: Right femur radiographs performed 01/27/2012

CLINICAL DATA: Status post fall, with right hip pain. Initial
encounter.

EXAM:
CT OF THE RIGHT HIP WITHOUT CONTRAST
TECHNIQUE: Multidetector CT imaging of the right hip was performed according to
the standard protocol. Multiplanar CT image reconstructions were
also generated.

[Series 3: pelvis st · axial · 0.76mm/px · z∈[-121,-41]mm · 2 of 48 slices shown]
[im 16/48  soft-tissue]
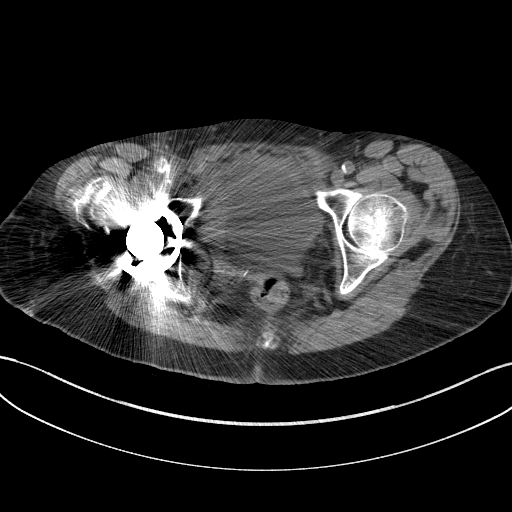
[im 32/48  soft-tissue]
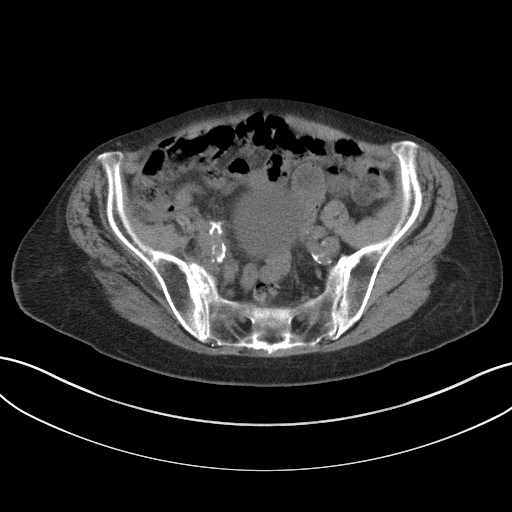

[Series 6: hip st · axial · 0.37mm/px · z∈[-359,-149]mm · 4 of 71 slices shown, 9 images]
[im 15/71  soft-tissue]
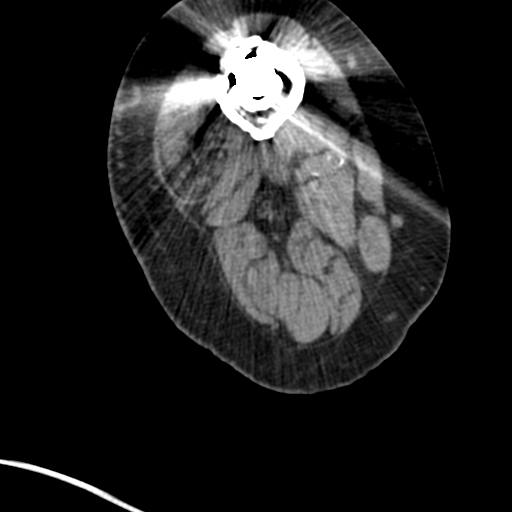
[im 15/71  lung]
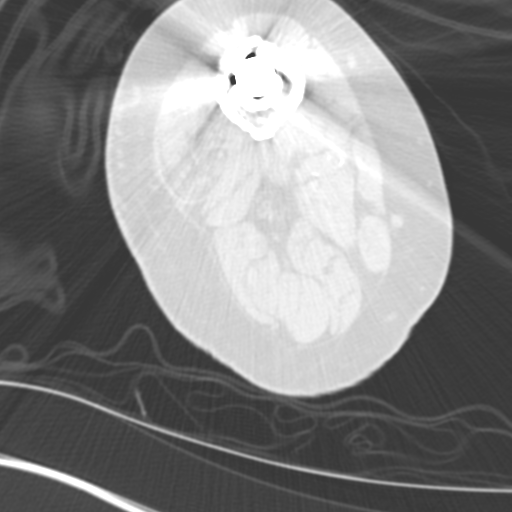
[im 15/71  bone]
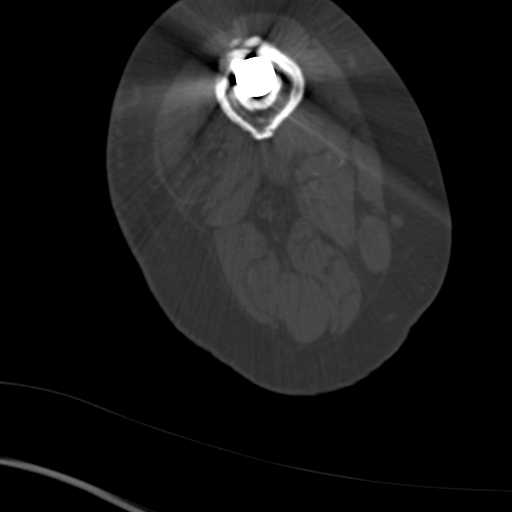
[im 29/71  soft-tissue]
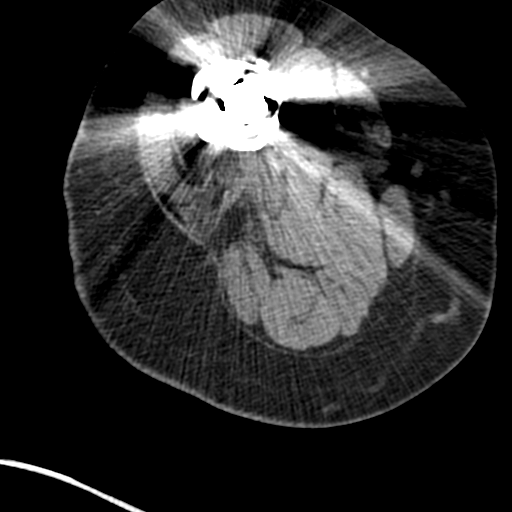
[im 29/71  lung]
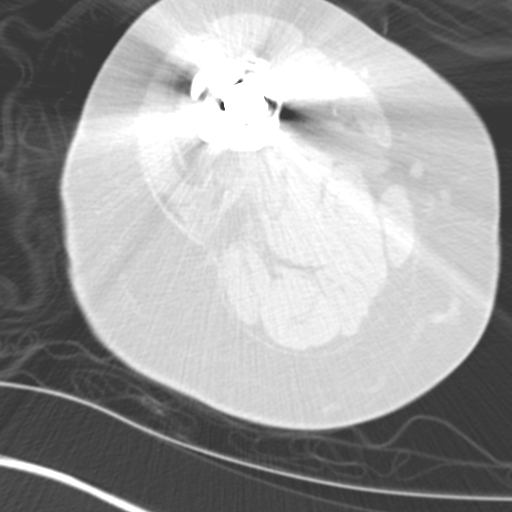
[im 43/71  soft-tissue]
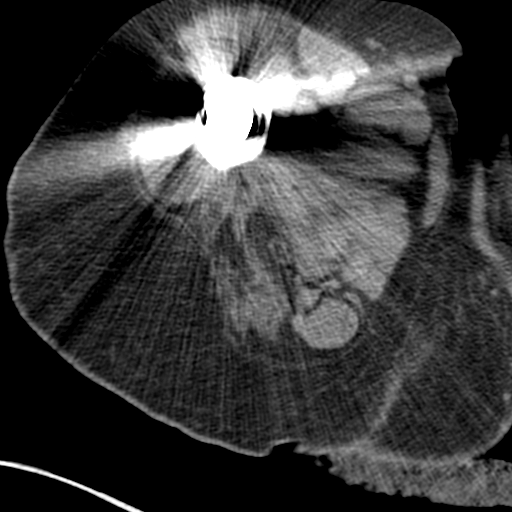
[im 43/71  lung]
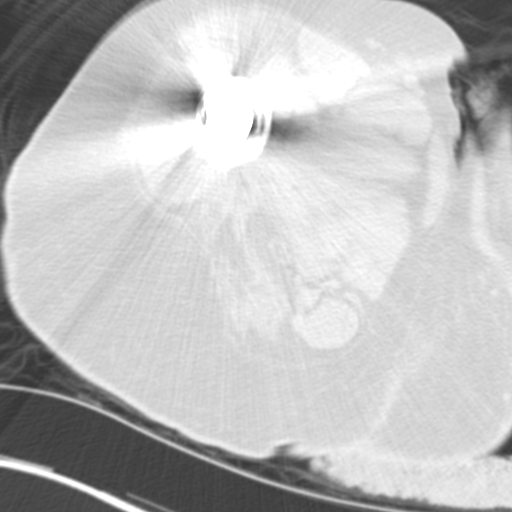
[im 57/71  soft-tissue]
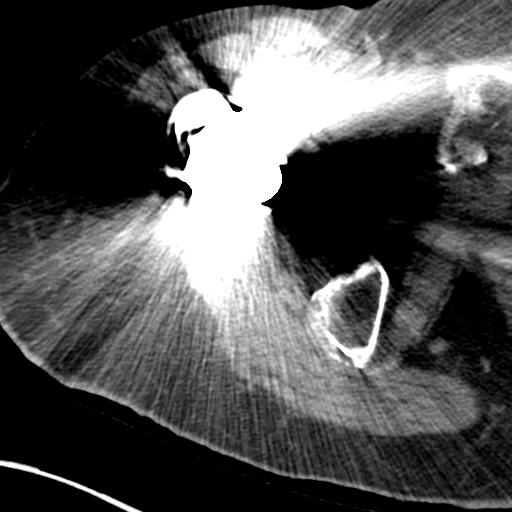
[im 57/71  lung]
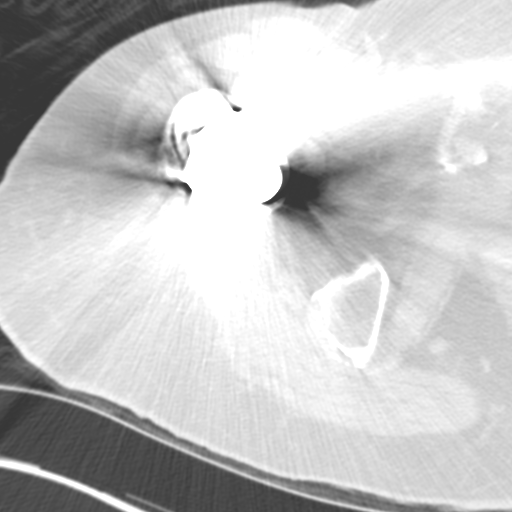

[Series 8: coronal images · coronal · 0.46mm/px · 3 of 97 slices shown, 4 images]
[im 33/97  soft-tissue]
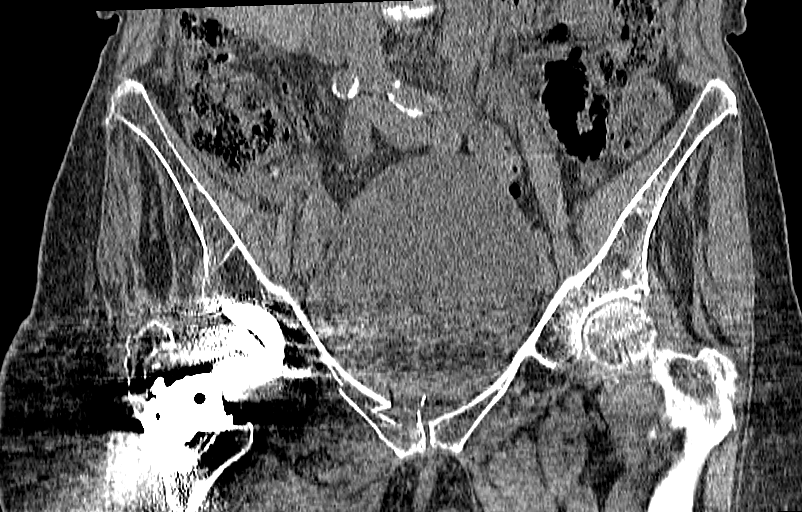
[im 43/97  soft-tissue]
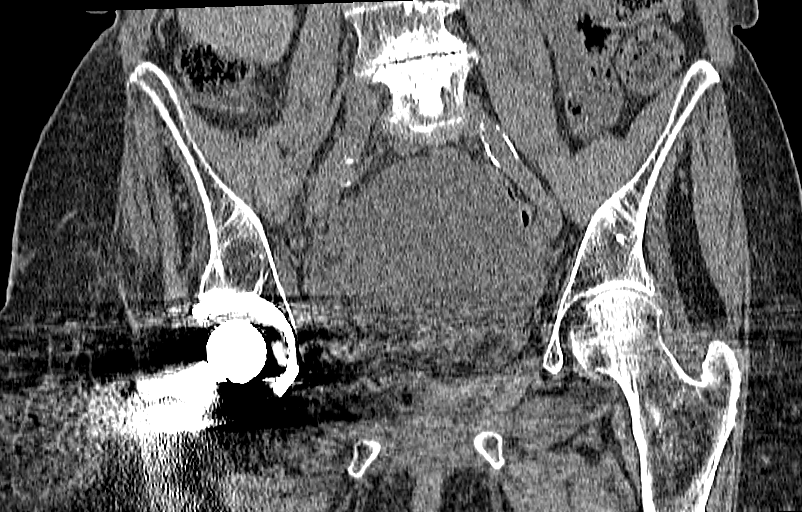
[im 43/97  bone]
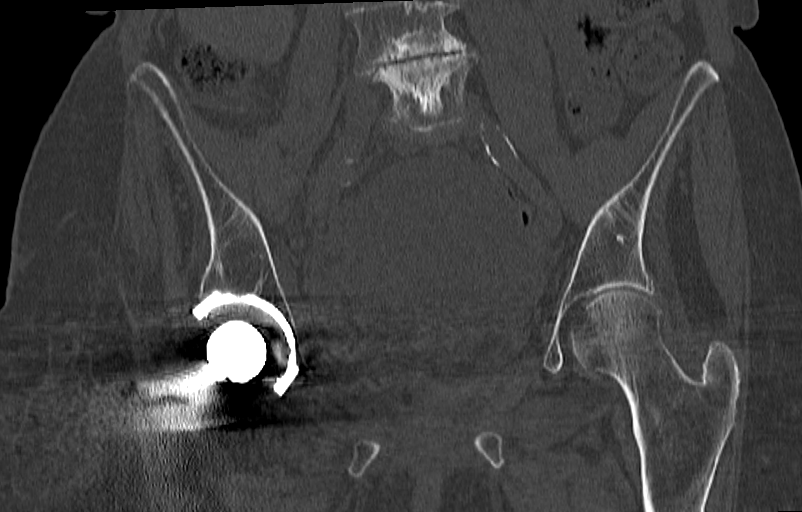
[im 54/97  soft-tissue]
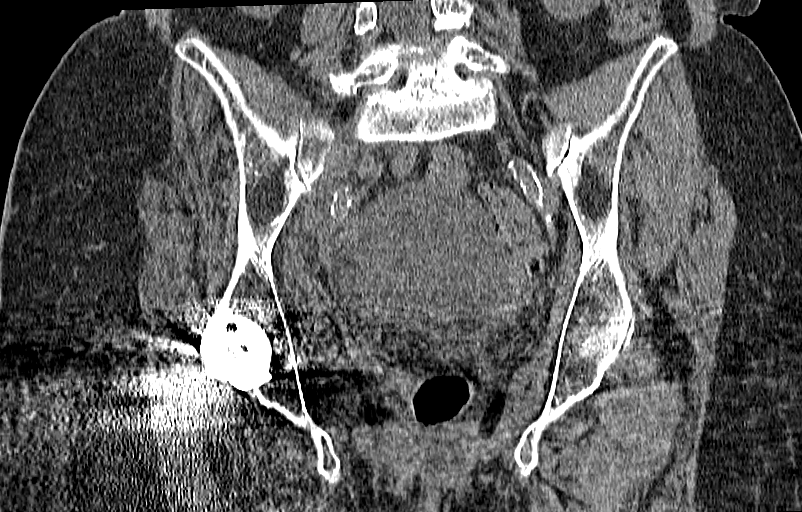

[Series 9: sagittal images · sagittal · 0.47mm/px · 1 of 175 slices shown, 2 images]
[im 88/175  soft-tissue]
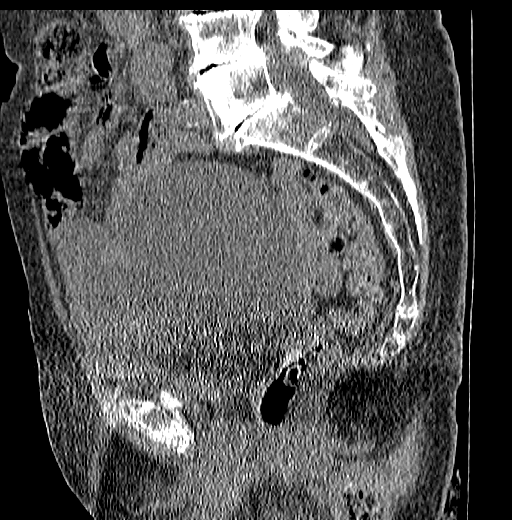
[im 88/175  bone]
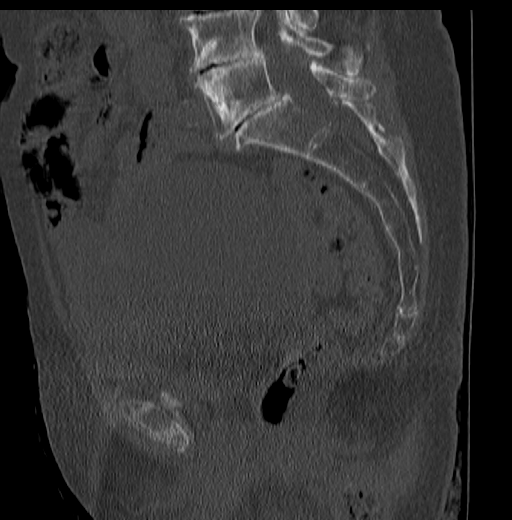

[10 of 46 positions shown; findings below may reference images not displayed]

FINDINGS: Bones/Joint/Cartilage

There is a mildly comminuted fracture involving the right side of
the pubic symphysis, with mild displacement. No additional fractures
are seen.

The patient's right hip arthroplasty appears intact, with associated
cerclage wires. There appears to be mildly increased lucency about
the proximal aspect of the prosthesis, which may reflect some degree
of chronic loosening. Additional concrete is noted at the distal
femoral stem, since the right femur radiographs from 5652.

Ligaments

Suboptimally assessed by CT.

Muscles and Tendons

There is partial atrophy of the vastus lateralis musculature and
lateral gluteus musculature. Visualized tendons are grossly
unremarkable.

Soft tissues

Scattered vascular calcifications are seen. Soft tissue injury is
seen tracking along the right pelvic sidewall and at the space of
Retzius.
IMPRESSION: 1. Mildly comminuted fracture involving the right side of the pubic
symphysis, with mild displacement.
2. Soft tissue injury along the right pelvic sidewall and at the
space of Retzius.
3. Mildly increased lucency about the proximal aspect of the
prosthesis since [DATE] reflect some degree of chronic loosening.
Right hip arthroplasty otherwise grossly intact.
4. Scattered vascular calcifications seen.
5. Partial atrophy of the right vastus lateralis and lateral gluteus
musculature.

## 2019-08-30 IMAGING — CR DG HIP (WITH OR WITHOUT PELVIS) 2-3V*R*
4 series · 4 of 4 positions shown · non-contrast
Comparison: CT Abdomen and Pelvis 05/01/2015. Right knee series [DATE].

CLINICAL DATA: 88-year-old female status post fall onto right hip
with pain. Prior hip replacement.

EXAM:
DG HIP (WITH OR WITHOUT PELVIS) 2-3V RIGHT

[x pelvis (1 of 2)]
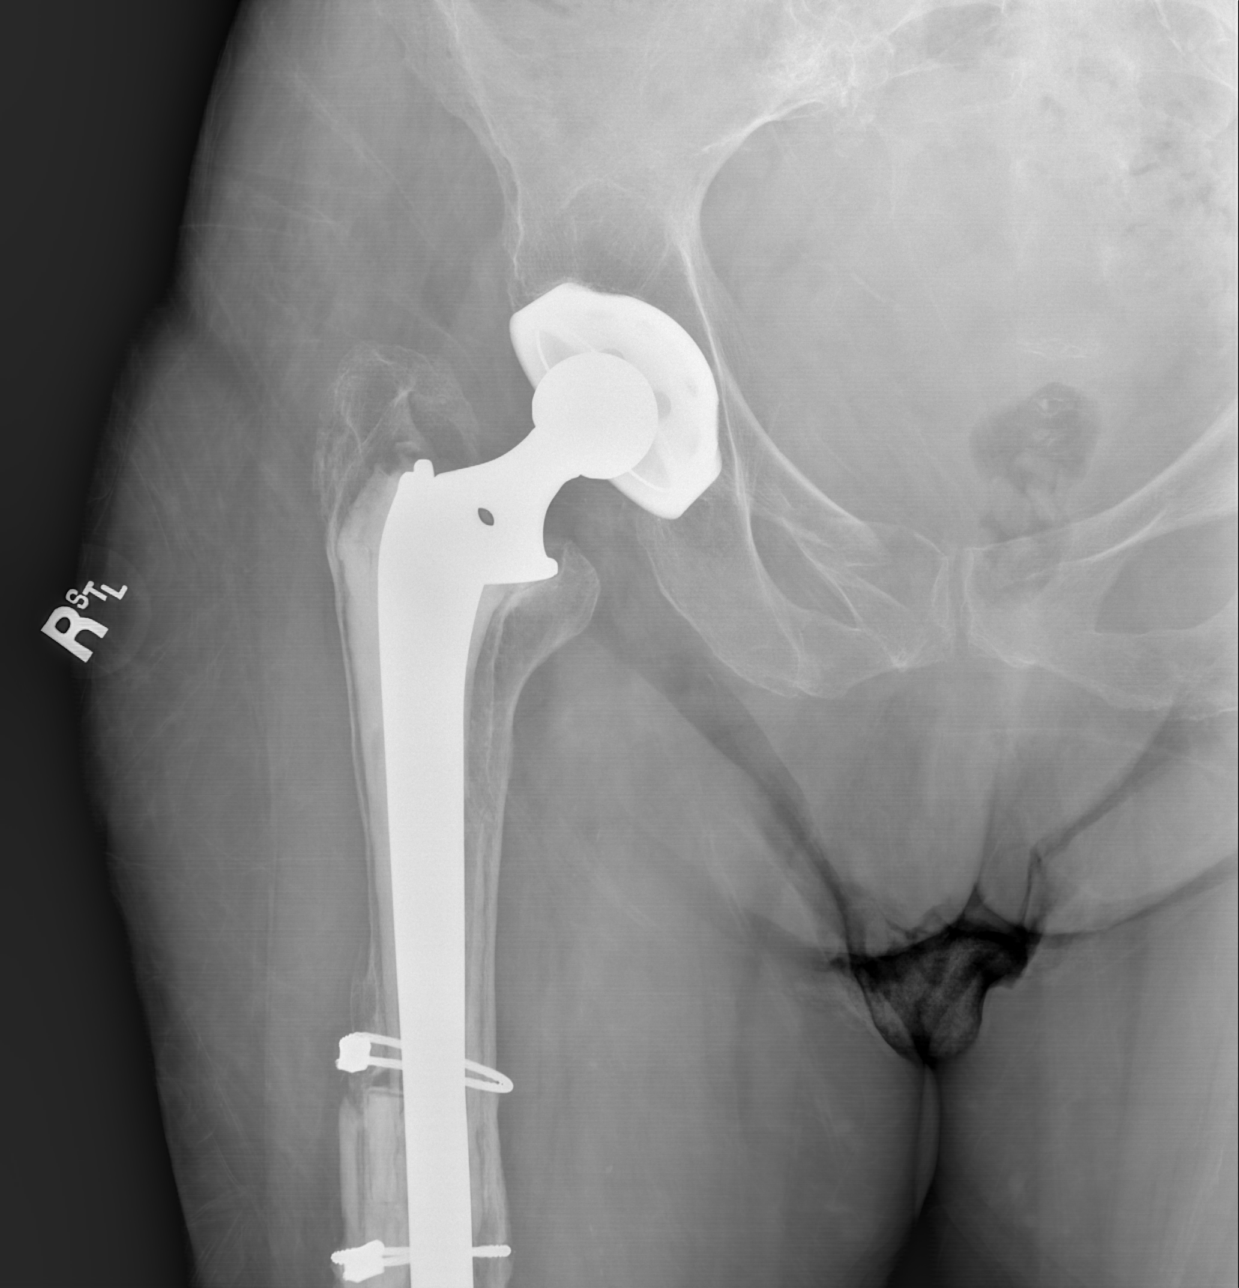

[x pelvis (2 of 2)]
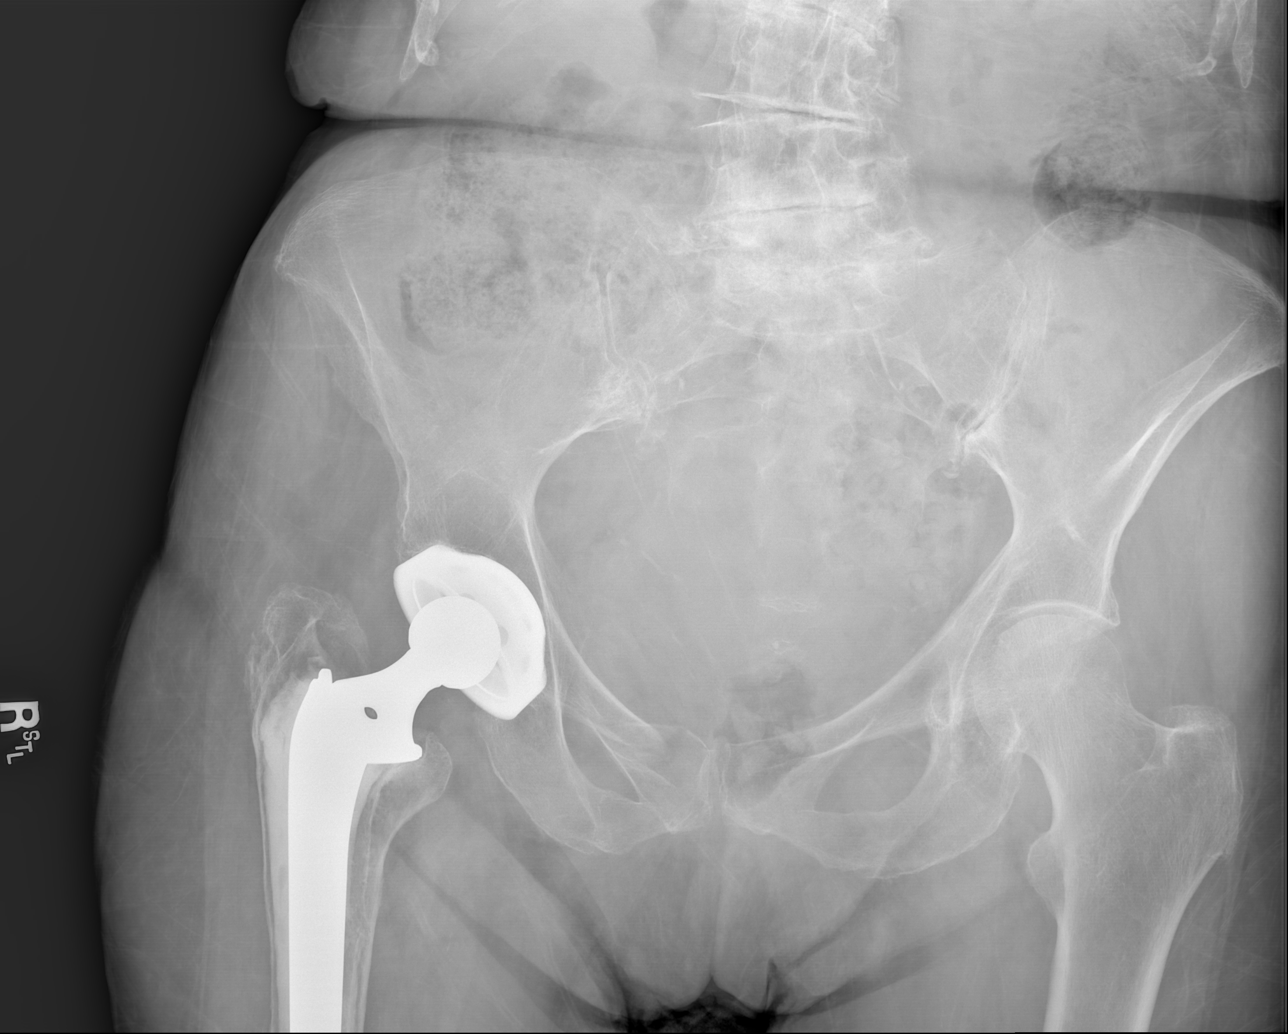

[w hip lat right (1 of 2)]
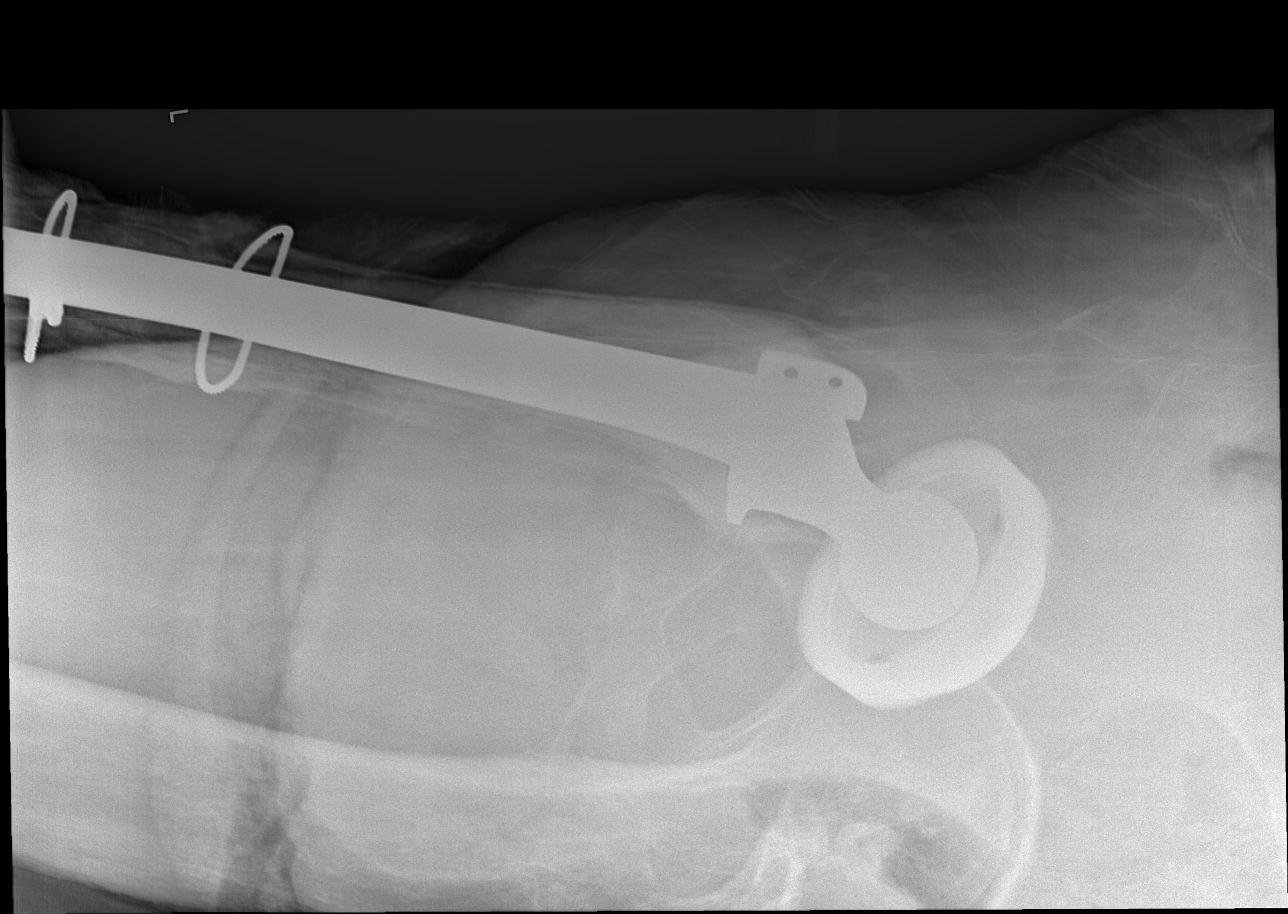

[w hip lat right (2 of 2)]
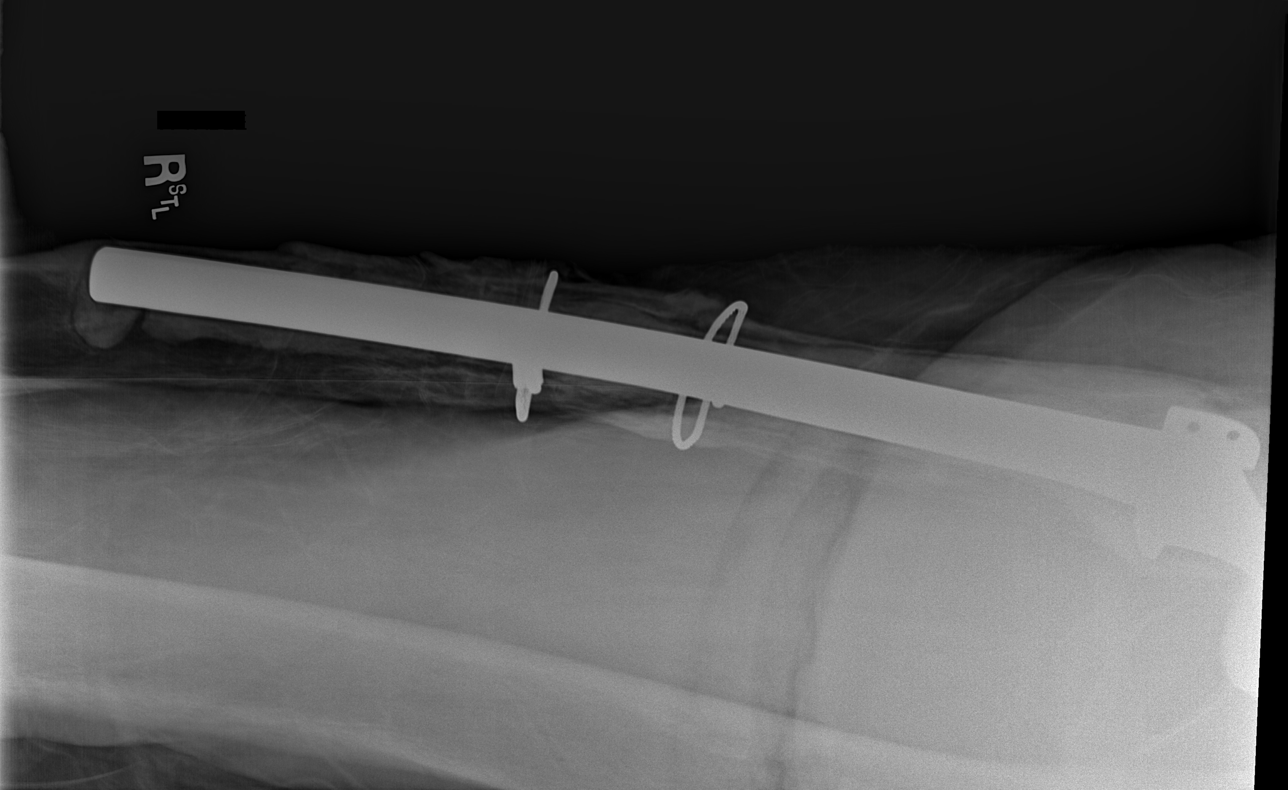

[4 of 4 positions shown; findings below may reference images not displayed]

FINDINGS: Osteopenia. Fractures of the right inferior and superior pubic ramus
appear to be chronic. The pelvis elsewhere appears intact. Grossly
intact proximal left femur. Partially visible advanced lower lumbar
disc and endplate degeneration.

Chronic right hip arthroplasty with elongated, cemented right
femoral stem and two cerclage wires. The right hip components appear
intact and normally aligned. Chronic lucency cephalad of the
acetabular component is stable. No definite proximal right femur
fracture.
IMPRESSION: 1. Right superior and inferior pubic rami fractures appear chronic.
2. Chronic right hip arthroplasty appears stable.
3.  No acute fracture or dislocation identified.
